# Patient Record
Sex: Female | Born: 1980 | Race: White | Hispanic: No | Marital: Married | Smoking: Former smoker
Health system: Southern US, Community
[De-identification: ages and names within clinical notes are randomized; demographics above are authoritative.]

## PROBLEM LIST (undated history)

## (undated) DIAGNOSIS — J45909 Unspecified asthma, uncomplicated: Secondary | ICD-10-CM

## (undated) DIAGNOSIS — E162 Hypoglycemia, unspecified: Secondary | ICD-10-CM

---

## 2009-06-30 ENCOUNTER — Emergency Department: Payer: Self-pay | Admitting: Emergency Medicine

## 2013-11-08 ENCOUNTER — Emergency Department: Payer: Self-pay | Admitting: Emergency Medicine

## 2013-11-09 ENCOUNTER — Emergency Department: Payer: Self-pay | Admitting: Emergency Medicine

## 2013-11-09 LAB — CBC
HCT: 36.7 % (ref 35.0–47.0)
HGB: 12.4 g/dL (ref 12.0–16.0)
MCH: 31.3 pg (ref 26.0–34.0)
MCHC: 33.9 g/dL (ref 32.0–36.0)
MCV: 92 fL (ref 80–100)
PLATELETS: 200 10*3/uL (ref 150–440)
RBC: 3.97 10*6/uL (ref 3.80–5.20)
RDW: 13.9 % (ref 11.5–14.5)
WBC: 9.7 10*3/uL (ref 3.6–11.0)

## 2013-12-03 ENCOUNTER — Emergency Department: Payer: Self-pay | Admitting: Emergency Medicine

## 2013-12-03 LAB — CBC
HCT: 38.8 % (ref 35.0–47.0)
HGB: 13.2 g/dL (ref 12.0–16.0)
MCH: 30.7 pg (ref 26.0–34.0)
MCHC: 33.9 g/dL (ref 32.0–36.0)
MCV: 91 fL (ref 80–100)
PLATELETS: 209 10*3/uL (ref 150–440)
RBC: 4.28 10*6/uL (ref 3.80–5.20)
RDW: 13.9 % (ref 11.5–14.5)
WBC: 9.7 10*3/uL (ref 3.6–11.0)

## 2013-12-03 LAB — BASIC METABOLIC PANEL
Anion Gap: 10 (ref 7–16)
BUN: 15 mg/dL (ref 7–18)
Calcium, Total: 8.5 mg/dL (ref 8.5–10.1)
Chloride: 105 mmol/L (ref 98–107)
Co2: 26 mmol/L (ref 21–32)
Creatinine: 0.86 mg/dL (ref 0.60–1.30)
EGFR (African American): >60
EGFR (Non-African Amer.): >60
GLUCOSE: 96 mg/dL (ref 65–99)
Osmolality: 282 (ref 275–301)
POTASSIUM: 3.7 mmol/L (ref 3.5–5.1)
SODIUM: 141 mmol/L (ref 136–145)

## 2013-12-03 LAB — TROPONIN I: Troponin-I: <0.02 ng/mL

## 2014-04-16 ENCOUNTER — Emergency Department (HOSPITAL_COMMUNITY)
Admission: EM | Admit: 2014-04-16 | Discharge: 2014-04-16 | Disposition: A | Discharge status: Home / Self Care | Payer: Medicaid Other | Attending: Emergency Medicine | Admitting: Emergency Medicine

## 2014-04-16 ENCOUNTER — Encounter (HOSPITAL_COMMUNITY): Payer: Self-pay | Admitting: Emergency Medicine

## 2014-04-16 DIAGNOSIS — R109 Unspecified abdominal pain: Secondary | ICD-10-CM | POA: Diagnosis present

## 2014-04-16 DIAGNOSIS — Z79899 Other long term (current) drug therapy: Secondary | ICD-10-CM | POA: Insufficient documentation

## 2014-04-16 DIAGNOSIS — J45909 Unspecified asthma, uncomplicated: Secondary | ICD-10-CM | POA: Insufficient documentation

## 2014-04-16 DIAGNOSIS — R102 Pelvic and perineal pain: Secondary | ICD-10-CM

## 2014-04-16 DIAGNOSIS — T836XXA Infection and inflammatory reaction due to prosthetic device, implant and graft in genital tract, initial encounter: Secondary | ICD-10-CM | POA: Insufficient documentation

## 2014-04-16 DIAGNOSIS — N719 Inflammatory disease of uterus, unspecified: Secondary | ICD-10-CM

## 2014-04-16 DIAGNOSIS — Z3202 Encounter for pregnancy test, result negative: Secondary | ICD-10-CM | POA: Diagnosis not present

## 2014-04-16 DIAGNOSIS — Y848 Other medical procedures as the cause of abnormal reaction of the patient, or of later complication, without mention of misadventure at the time of the procedure: Secondary | ICD-10-CM | POA: Insufficient documentation

## 2014-04-16 DIAGNOSIS — T8369XA Infection and inflammatory reaction due to other prosthetic device, implant and graft in genital tract, initial encounter: Secondary | ICD-10-CM

## 2014-04-16 HISTORY — DX: Unspecified asthma, uncomplicated: J45.909

## 2014-04-16 LAB — URINE MICROSCOPIC-ADD ON

## 2014-04-16 LAB — URINALYSIS, ROUTINE W REFLEX MICROSCOPIC
BILIRUBIN URINE: NEGATIVE
Glucose, UA: NEGATIVE mg/dL
Ketones, ur: NEGATIVE mg/dL
Leukocytes, UA: NEGATIVE
Nitrite: NEGATIVE
PROTEIN: NEGATIVE mg/dL
Specific Gravity, Urine: 1.014 (ref 1.005–1.030)
Urobilinogen, UA: 0.2 mg/dL (ref 0.0–1.0)
pH: 7 (ref 5.0–8.0)

## 2014-04-16 LAB — WET PREP, GENITAL
Clue Cells Wet Prep HPF POC: NONE SEEN
TRICH WET PREP: NONE SEEN
WBC, Wet Prep HPF POC: NONE SEEN
YEAST WET PREP: NONE SEEN

## 2014-04-16 LAB — POC URINE PREG, ED: Preg Test, Ur: NEGATIVE

## 2014-04-16 MED ORDER — CEFTRIAXONE SODIUM 250 MG IJ SOLR: 250.0000 mg | Freq: Once | INTRAMUSCULAR | Status: DC

## 2014-04-16 MED ORDER — LIDOCAINE HCL (PF) 1 % IJ SOLN
INTRAMUSCULAR | Status: AC
  Administered 2014-04-16: 5 mL
  Filled 2014-04-16: qty 5

## 2014-04-16 MED ORDER — DOXYCYCLINE HYCLATE 100 MG PO CAPS: 100.0000 mg | ORAL_CAPSULE | Freq: Two times a day (BID) | ORAL | Status: DC

## 2014-04-16 MED ORDER — AZITHROMYCIN 250 MG PO TABS: 1000.0000 mg | ORAL_TABLET | Freq: Once | ORAL | Status: DC

## 2014-04-16 MED ORDER — CEFTRIAXONE SODIUM 250 MG IJ SOLR
250.0000 mg | Freq: Once | INTRAMUSCULAR | Status: AC
  Administered 2014-04-16: 250 mg via INTRAMUSCULAR
  Filled 2014-04-16: qty 250

## 2014-04-16 MED ORDER — METRONIDAZOLE 500 MG PO TABS: 500.0000 mg | ORAL_TABLET | Freq: Three times a day (TID) | ORAL | Status: DC

## 2014-04-16 MED ORDER — HYDROCODONE-ACETAMINOPHEN 5-325 MG PO TABS: 2.0000 | ORAL_TABLET | ORAL | Status: DC | PRN

## 2014-04-16 MED ORDER — LIDOCAINE HCL (PF) 1 % IJ SOLN
5.0000 mL | Freq: Once | INTRAMUSCULAR | Status: AC
  Administered 2014-04-16: 5 mL

## 2014-04-16 MED ORDER — AZITHROMYCIN 1 G PO PACK
1.0000 g | PACK | Freq: Once | ORAL | Status: AC
  Administered 2014-04-16: 1 g via ORAL
  Filled 2014-04-16: qty 1

## 2014-04-16 NOTE — ED Notes (Signed)
Pt here for abd pain, bleeding. sts that she has had an IUD for years and was told to have it surgically removed but never did. sts now she is bleeding and having pain and breast are tender.

## 2014-04-16 NOTE — Discharge Instructions (Signed)
Pelvic Pain Pelvic pain is pain felt below the belly button and between your hips. It can be caused by many different things. It is important to get help right away. This is especially true for severe, sharp, or unusual pain that comes on suddenly.  HOME CARE  Only take medicine as told by your doctor.  Rest as told by your doctor.  Eat a healthy diet, such as fruits, vegetables, and lean meats.  Drink enough fluids to keep your pee (urine) clear or pale yellow, or as told.  Avoid sex (intercourse) if it causes pain.  Apply warm or cold packs to your lower belly (abdomen). Use the type of pack that helps the pain.  Avoid situations that cause you stress.  Keep a journal to track your pain. Write down:  When the pain started.  Where it is located.  If there are things that seem to be related to the pain, such as food or your period.  Follow up with your doctor as told. GET HELP RIGHT AWAY IF:   You have heavy bleeding from the vagina.  You have more pelvic pain.  You feel lightheaded or pass out (faint).  You have chills.  You have pain when you pee or have blood in your pee.  You cannot stop having watery poop (diarrhea).  You cannot stop throwing up (vomiting).  You have a fever or lasting symptoms for more than 3 days.  You have a fever and your symptoms suddenly get worse.  You are being physically or sexually abused.  Your medicine does not help your pain.  You have fluid (discharge) coming from your vagina that is not normal. MAKE SURE YOU:  Understand these instructions.  Will watch your condition.  Will get help if you are not doing well or get worse. Document Released: 06/08/2007 Document Revised: 06/21/2011 Document Reviewed: 04/11/2011 [facility] Patient Information 2015 [place], [place]. This information is not intended to replace advice given to you by your health care provider. Make sure you discuss any questions you have with your health care  provider.  Endometritis Endometritis is an irritation, soreness, and swelling (inflammation) of the lining of the uterus (endometrium).  CAUSES   Bacterial infections.  Sexually transmitted infections (STIs).  Having a miscarriage or childbirth, especially after a long labor or cesarean delivery.  Certain gynecological procedures (such as dilation and curettage, hysteroscopy, or contraceptive insertion). SIGNS AND SYMPTOMS   Fever.  Lower abdominal or pelvic pain.  Abnormal vaginal discharge or bleeding.  Abdominal bloating (distention) or swelling.  General discomfort or ill feeling.  Discomfort with bowel movements. DIAGNOSIS  A physical and pelvic exam are performed. Other tests may include:  Cultures from the cervix.  Blood tests.  Examining a tissue sample of the uterine lining (endometrial biopsy).  Examining discharge under a microscope (wet prep).  Laparoscopy. TREATMENT  Antibiotic medicines are usually given. Other treatments may include:  Fluids through an IV tube inserted in your vein.  Rest. HOME CARE INSTRUCTIONS   Take over-the-counter or prescription medicines for pain, discomfort, or fever as directed by your health care provider.  Take your antibiotics as directed. Finish them even if you start to feel better.  Resume your normal diet and activities as directed or as tolerated.  Do not douche or have sexual intercourse until your health care provider says it is okay.  Do not have sexual intercourse until your partner has been treated if your endometritis is caused by an STI. SEEK IMMEDIATE  MEDICAL CARE IF:   You have swelling or increasing pain in the abdomen.  You have a fever.  You have bad smelling vaginal discharge, or you have an increased amount of discharge.  You have abnormal vaginal bleeding.  Your medicine is not helping with the pain.  You experience any problems that may be related to the medicine you are taking.  You  have nausea and vomiting, or you cannot keep foods down.  You have pain with bowel movements. MAKE SURE YOU:   Understand these instructions.  Will watch your condition.  Will get help right away if you are not doing well or get worse. Document Released: 12/14/2000 Document Revised: 08/22/2012 Document Reviewed: 07/19/2012 [facility] Patient Information 2015 [employer], [place]. This information is not intended to replace advice given to you by your health care provider. Make sure you discuss any questions you have with your health care provider.

## 2014-04-16 NOTE — ED Provider Notes (Signed)
CSN: [id number]     Arrival date & time 04/16/14  [id number] History   First MD Initiated Contact with Patient 04/16/14 0813     Chief Complaint  Patient presents with  . Vaginal Bleeding  . Abdominal Pain      HPI  Patient presents for evaluation of abdominal pain, and some vaginal bleeding. Has had an IUD in for approximately 8-10 years. States at one point she was told the string was no longer visible, but had been removed "surgical". She's not had a period for over 8 years. Has symmetrical bleeding yesterday and thought she was getting her menses. However she has suprapubic pain that has become worse and she presents here. Denies dyspareunia. Did not note any discharge, however has discharged on exam today. No dysuria or frank hematuria no fevers or chills. Good appetite no vomiting.  Past Medical History  Diagnosis Date  . Asthma    Past Surgical History  Procedure Laterality Date  . Cesarean section     History reviewed. No pertinent family history. History  Substance Use Topics  . Smoking status: Never Smoker   . Smokeless tobacco: Not on file  . Alcohol Use: No   OB History    Gravida Para Term Preterm AB TAB SAB Ectopic Multiple Living   6 3 3       3      Review of Systems  Constitutional: Negative for fever, chills, diaphoresis, appetite change and fatigue.  HENT: Negative for mouth sores, sore throat and trouble swallowing.   Eyes: Negative for visual disturbance.  Respiratory: Negative for cough, chest tightness, shortness of breath and wheezing.   Cardiovascular: Negative for chest pain.  Gastrointestinal: Negative for nausea, vomiting, abdominal pain, diarrhea and abdominal distention.  Endocrine: Negative for polydipsia, polyphagia and polyuria.  Genitourinary: Positive for vaginal bleeding and pelvic pain. Negative for dysuria, frequency and hematuria.  Musculoskeletal: Negative for gait problem.  Skin: Negative for color change, pallor and rash.  Neurological:  Negative for dizziness, syncope, light-headedness and headaches.  Hematological: Does not bruise/bleed easily.  Psychiatric/Behavioral: Negative for behavioral problems and confusion.      Allergies  Review of patient's allergies indicates no known allergies.  Home Medications   Prior to Admission medications   Medication Sig Start Date End Date Taking? Authorizing Provider  Ascorbic Acid (VITAMIN C PO) Take 1 tablet by mouth daily.    Yes Historical Provider, MD  GOLDEN SEAL PO Take 1 tablet by mouth daily.    Yes Historical Provider, MD  Myrrh OIL 1 each by Does not apply route daily.    Yes Historical Provider, MD  Specialty Vitamins Products (ECHINACEA C COMPLETE PO) Take 1 tablet by mouth daily.    Yes Historical Provider, MD  doxycycline (VIBRAMYCIN) 100 MG capsule Take 1 capsule (100 mg total) by mouth 2 (two) times daily. 04/16/14   Rolland PorterMark Tauheedah Bok, MD  HYDROcodone-acetaminophen (NORCO/VICODIN) 5-325 MG per tablet Take 2 tablets by mouth every 4 (four) hours as needed. 04/16/14   Rolland PorterMark Richelle Glick, MD  metroNIDAZOLE (FLAGYL) 500 MG tablet Take 1 tablet (500 mg total) by mouth 3 (three) times daily. 04/16/14   Rolland PorterMark Starletta Houchin, MD   BP 112/54 mmHg  Pulse 71  Temp(Src) 98.1 F (36.7 C) (Oral)  Resp 20  SpO2 100% Physical Exam  Constitutional: She is oriented to person, place, and time. She appears well-developed and well-nourished. No distress.  HENT:  Head: Normocephalic.  Eyes: Conjunctivae are normal. Pupils are equal, round, and reactive  to light. No scleral icterus.  Neck: Normal range of motion. Neck supple. No thyromegaly present.  Cardiovascular: Normal rate and regular rhythm.  Exam reveals no gallop and no friction rub.   No murmur heard. Pulmonary/Chest: Effort normal and breath sounds normal. No respiratory distress. She has no wheezes. She has no rales.  Abdominal: Soft. Bowel sounds are normal. She exhibits no distension. There is no tenderness. There is no rebound.    Genitourinary:    Musculoskeletal: Normal range of motion.  Neurological: She is alert and oriented to person, place, and time.  Skin: Skin is warm and dry. No rash noted.  Psychiatric: She has a normal mood and affect. Her behavior is normal.    ED Course  Procedures (including critical care time) Labs Review Labs Reviewed  URINALYSIS, ROUTINE W REFLEX MICROSCOPIC - Abnormal; Notable for the following:    Hgb urine dipstick SMALL (*)    All other components within normal limits  WET PREP, GENITAL  URINE MICROSCOPIC-ADD ON  POC URINE PREG, ED  GC/CHLAMYDIA PROBE AMP ([place])    Imaging Review No results found.   EKG Interpretation None      MDM   Final diagnoses:  Pelvic pain in female  Endometritis  IUD infection, initial encounter    Into the consistent with pelvic infection. Urine does not show obvious infection and not pregnant. She does report some breast tenderness. We discussed the possibility this being some hormonal change and may be actually just premenstrual her dysmenorrhea. However with the tenderness and discharged my preference was to treat her empirically with consideration of having an IUD. Given IM Rocephin abuse of cervix. Discharge with doxycycline and Flagyl in GYN follow-up.    Rolland Porter, MD 04/16/14 1055

## 2014-04-16 NOTE — Progress Notes (Signed)
pcp is [facility] [address] [place], [place] [phone]

## 2014-04-17 LAB — GC/CHLAMYDIA PROBE AMP (~~LOC~~) NOT AT ARMC
CHLAMYDIA, DNA PROBE: NEGATIVE
NEISSERIA GONORRHEA: NEGATIVE

## 2014-05-09 ENCOUNTER — Encounter (HOSPITAL_COMMUNITY): Payer: Self-pay | Admitting: Nurse Practitioner

## 2014-05-09 ENCOUNTER — Emergency Department (HOSPITAL_COMMUNITY)
Admission: EM | Admit: 2014-05-09 | Discharge: 2014-05-09 | Disposition: A | Discharge status: Home / Self Care | Payer: Medicaid Other | Attending: Emergency Medicine | Admitting: Emergency Medicine

## 2014-05-09 ENCOUNTER — Emergency Department (HOSPITAL_COMMUNITY): Payer: Medicaid Other

## 2014-05-09 DIAGNOSIS — R102 Pelvic and perineal pain: Secondary | ICD-10-CM | POA: Diagnosis not present

## 2014-05-09 DIAGNOSIS — Z79899 Other long term (current) drug therapy: Secondary | ICD-10-CM | POA: Insufficient documentation

## 2014-05-09 DIAGNOSIS — M545 Low back pain: Secondary | ICD-10-CM | POA: Diagnosis not present

## 2014-05-09 DIAGNOSIS — Z9889 Other specified postprocedural states: Secondary | ICD-10-CM | POA: Insufficient documentation

## 2014-05-09 DIAGNOSIS — J45909 Unspecified asthma, uncomplicated: Secondary | ICD-10-CM | POA: Insufficient documentation

## 2014-05-09 DIAGNOSIS — Z792 Long term (current) use of antibiotics: Secondary | ICD-10-CM | POA: Insufficient documentation

## 2014-05-09 DIAGNOSIS — Z3202 Encounter for pregnancy test, result negative: Secondary | ICD-10-CM | POA: Diagnosis not present

## 2014-05-09 LAB — URINALYSIS, ROUTINE W REFLEX MICROSCOPIC
Bilirubin Urine: NEGATIVE
Glucose, UA: NEGATIVE mg/dL
Hgb urine dipstick: NEGATIVE
Ketones, ur: NEGATIVE mg/dL
Leukocytes, UA: NEGATIVE
Nitrite: NEGATIVE
Protein, ur: NEGATIVE mg/dL
Specific Gravity, Urine: 1.019 (ref 1.005–1.030)
Urobilinogen, UA: 0.2 mg/dL (ref 0.0–1.0)
pH: 6 (ref 5.0–8.0)

## 2014-05-09 LAB — PREGNANCY, URINE: Preg Test, Ur: NEGATIVE

## 2014-05-09 MED ORDER — OXYCODONE-ACETAMINOPHEN 5-325 MG PO TABS
1.0000 | ORAL_TABLET | Freq: Once | ORAL | Status: DC
  Filled 2014-05-09: qty 1

## 2014-05-09 MED ORDER — HYDROCODONE-ACETAMINOPHEN 5-325 MG PO TABS: 1.0000 | ORAL_TABLET | Freq: Four times a day (QID) | ORAL | Status: DC | PRN

## 2014-05-09 MED ORDER — IBUPROFEN 400 MG PO TABS
800.0000 mg | ORAL_TABLET | Freq: Once | ORAL | Status: AC
  Administered 2014-05-09: 800 mg via ORAL
  Filled 2014-05-09: qty 2

## 2014-05-09 MED ORDER — IBUPROFEN 800 MG PO TABS: 800.0000 mg | ORAL_TABLET | Freq: Three times a day (TID) | ORAL | Status: DC | PRN

## 2014-05-09 NOTE — ED Provider Notes (Signed)
CSN: [id number]     Arrival date & time 05/09/14  1214 History  This chart was scribed for non-physician practitioner Ebbie Ridgehris Escarlet Saathoff, PA, working with Glynn OctaveStephen Rancour, MD, by Tanda RockersMargaux Venter, ED Scribe. This patient was seen in room TR03C/TR03C and the patient's care was started at 1:41 PM.     Chief Complaint  Patient presents with  . Pelvic Pain    The history is provided by the patient. No language interpreter was used.     HPI Comments: Melanie Jenkins is a 34 y.o. female who presents to the Emergency Department complaining of lower back pain and suprapubic pain that began approximately 3 weeks ago. Pt states that the back pain is greater than the suprapubic pain. She describes the pain as severe and cramping in sensation. The pain is exacerbated with sitting and movement. The only time the pain is alleviated is with lying down. Pt was seen in the ED on 4/13 (approximately 3 weeks ago) for abdominal pain and mild vaginal bleeding. She was diagnosed with an IUD infection at that time and told to follow up with her OBGYN. Pt saw her OBGYN approximately 2 days ago. They were unable to find her IUD at that time and scheduled an ultrasound for next week. She was told to come to the ED if her pain worsens. She denies fever, chills, nausea, vomiting, vaginal discharge, or any other symptoms.   Past Medical History  Diagnosis Date  . Asthma    Past Surgical History  Procedure Laterality Date  . Cesarean section     History reviewed. No pertinent family history. History  Substance Use Topics  . Smoking status: Never Smoker   . Smokeless tobacco: Not on file  . Alcohol Use: No   OB History    Gravida Para Term Preterm AB TAB SAB Ectopic Multiple Living   6 3 3       3      Review of Systems  A complete 10 system review of systems was obtained and all systems are negative except as noted in the HPI and PMH.    Allergies  Review of patient's allergies indicates no known allergies.  Home  Medications   Prior to Admission medications   Medication Sig Start Date End Date Taking? Authorizing Provider  Ascorbic Acid (VITAMIN C PO) Take 1 tablet by mouth daily.     Historical Provider, MD  doxycycline (VIBRAMYCIN) 100 MG capsule Take 1 capsule (100 mg total) by mouth 2 (two) times daily. 04/16/14   Rolland PorterMark James, MD  GOLDEN SEAL PO Take 1 tablet by mouth daily.     Historical Provider, MD  HYDROcodone-acetaminophen (NORCO/VICODIN) 5-325 MG per tablet Take 2 tablets by mouth every 4 (four) hours as needed. 04/16/14   Rolland PorterMark James, MD  metroNIDAZOLE (FLAGYL) 500 MG tablet Take 1 tablet (500 mg total) by mouth 3 (three) times daily. 04/16/14   Rolland PorterMark James, MD  Myrrh OIL 1 each by Does not apply route daily.     Historical Provider, MD  Specialty Vitamins Products (ECHINACEA C COMPLETE PO) Take 1 tablet by mouth daily.     Historical Provider, MD   Triage Vitals: BP 141/96 mmHg  Pulse 76  Temp(Src) 98.1 F (36.7 C) (Oral)  Resp 20  SpO2 100%   Physical Exam  Constitutional: She is oriented to person, place, and time. She appears well-developed and well-nourished. No distress.  HENT:  Head: Normocephalic and atraumatic.  Eyes: Conjunctivae and EOM are normal.  Neck: Neck  supple. No tracheal deviation present.  Cardiovascular: Normal rate.   Pulmonary/Chest: Effort normal. No respiratory distress.  Abdominal: Soft. There is tenderness.  Lower abdominal and pelvic pain diffusely.   Musculoskeletal: Normal range of motion.  Neurological: She is alert and oriented to person, place, and time.  Skin: Skin is warm and dry.  Psychiatric: She has a normal mood and affect. Her behavior is normal.  Nursing note and vitals reviewed.   ED Course  Procedures (including critical care time)  DIAGNOSTIC STUDIES: Oxygen Saturation is 100% on RA, normal by my interpretation.    COORDINATION OF CARE: 1:46 PM-Discussed treatment plan which includes UA and Urine pregnancy with pt at bedside and pt  agreed to plan.   Labs Review Labs Reviewed  URINALYSIS, ROUTINE W REFLEX MICROSCOPIC  PREGNANCY, URINE    Imaging Review US Transvaginal Non-ob  05/09/2014   CLINICAL DATA:  Left pelvic pain  EXAM: TRANSABDOMINAL AND TRANSVAGINAL ULTRASOUND OF PELVIS  DOPPLER ULTRASOUND OF OVARIES  TECHNIQUE: Both transabdominal and transvaginal ultrasound examinations of the pelvis were performed. Transabdominal technique was performed for global imaging of the pelvis including uterus, ovaries, adnexal regions, and pelvic cul-de-sac.  It was necessary to proceed with endovaginal exam following the transabdominal exam to visualize the ovaries and endometrium. Color and duplex Doppler ultrasound was utilized to evaluate blood flow to the ovaries.  COMPARISON:  None.  FINDINGS: Image detail and diagnostic sensitivity is diminished by patient size.  Uterus  Measurements: 9 x 5 x 5 cm. No fibroids or other mass visualized.  Endometrium  Thickness: 6 mm. There is an IUD which appears centrally located. No evidence of focal endometrial thickening.  Right ovary  Measurements: 2.9 x 1.9 x 2.7 cm. Normal appearance/no adnexal mass.  Left ovary  Measurements: 2.6 x 2.2 x 2.6 cm. Normal appearance/no adnexal mass.  Pulsed Doppler evaluation of both ovaries demonstrates normal low-resistance arterial and venous waveforms.  Other findings  No free fluid.  IMPRESSION: 1. No ovarian torsion or other explanation for pelvic pain. 2. IUD in unremarkable position.   Electronically Signed   By: Marnee Spring M.D.   On: 05/09/2014 15:29   US Pelvis Complete  05/09/2014   CLINICAL DATA:  Left pelvic pain  EXAM: TRANSABDOMINAL AND TRANSVAGINAL ULTRASOUND OF PELVIS  DOPPLER ULTRASOUND OF OVARIES  TECHNIQUE: Both transabdominal and transvaginal ultrasound examinations of the pelvis were performed. Transabdominal technique was performed for global imaging of the pelvis including uterus, ovaries, adnexal regions, and pelvic cul-de-sac.  It was  necessary to proceed with endovaginal exam following the transabdominal exam to visualize the ovaries and endometrium. Color and duplex Doppler ultrasound was utilized to evaluate blood flow to the ovaries.  COMPARISON:  None.  FINDINGS: Image detail and diagnostic sensitivity is diminished by patient size.  Uterus  Measurements: 9 x 5 x 5 cm. No fibroids or other mass visualized.  Endometrium  Thickness: 6 mm. There is an IUD which appears centrally located. No evidence of focal endometrial thickening.  Right ovary  Measurements: 2.9 x 1.9 x 2.7 cm. Normal appearance/no adnexal mass.  Left ovary  Measurements: 2.6 x 2.2 x 2.6 cm. Normal appearance/no adnexal mass.  Pulsed Doppler evaluation of both ovaries demonstrates normal low-resistance arterial and venous waveforms.  Other findings  No free fluid.  IMPRESSION: 1. No ovarian torsion or other explanation for pelvic pain. 2. IUD in unremarkable position.   Electronically Signed   By: Marnee Spring M.D.   On: 05/09/2014 15:29  [place] Art/ven Flow Abd Pelv Doppler  05/09/2014   CLINICAL DATA:  Left pelvic pain  EXAM: TRANSABDOMINAL AND TRANSVAGINAL ULTRASOUND OF PELVIS  DOPPLER ULTRASOUND OF OVARIES  TECHNIQUE: Both transabdominal and transvaginal ultrasound examinations of the pelvis were performed. Transabdominal technique was performed for global imaging of the pelvis including uterus, ovaries, adnexal regions, and pelvic cul-de-sac.  It was necessary to proceed with endovaginal exam following the transabdominal exam to visualize the ovaries and endometrium. Color and duplex Doppler ultrasound was utilized to evaluate blood flow to the ovaries.  COMPARISON:  None.  FINDINGS: Image detail and diagnostic sensitivity is diminished by patient size.  Uterus  Measurements: 9 x 5 x 5 cm. No fibroids or other mass visualized.  Endometrium  Thickness: 6 mm. There is an IUD which appears centrally located. No evidence of focal endometrial thickening.  Right ovary   Measurements: 2.9 x 1.9 x 2.7 cm. Normal appearance/no adnexal mass.  Left ovary  Measurements: 2.6 x 2.2 x 2.6 cm. Normal appearance/no adnexal mass.  Pulsed Doppler evaluation of both ovaries demonstrates normal low-resistance arterial and venous waveforms.  Other findings  No free fluid.  IMPRESSION: 1. No ovarian torsion or other explanation for pelvic pain. 2. IUD in unremarkable position.   Electronically Signed   By: Marnee SpringJonathon  Watts M.D.   On: 05/09/2014 15:29   I personally performed the services described in this documentation, which was scribed in my presence. The recorded information has been reviewed and is accurate.    She will be referred back to her GYN doctor.  The IUD is centrally located in her uterus.  The patient will be given pain control told to return here as needed.  The patient refused vaginal exam, she just had one on Tuesday.  Charlestine NightChristopher Trevione Wert, PA-C 05/09/14 1636  Glynn OctaveStephen Rancour, MD 05/09/14 [phone]

## 2014-05-09 NOTE — ED Notes (Signed)
Pt was refusing to get into a gown, stressed importance of getting undressed, will discuss with provider

## 2014-05-09 NOTE — Discharge Instructions (Signed)
Return here as needed.  Follow-up with your GYN doctor.  °

## 2014-05-09 NOTE — ED Notes (Addendum)
She reports she was diagnosed with infected IUD here on 4/13 but it was unable to be removed "because they could not find it." she has followed up with OBGYN and they scheduled her for an ultrasound but today her pelvic pain is worse. She states she could not even get ready today due to pain. Lying is the only thing that relieves pain. She denies vaginal discharge or urinary changes.  Denies fevers, chills, n/v.

## 2014-09-16 ENCOUNTER — Emergency Department (HOSPITAL_COMMUNITY): Payer: Medicaid Other

## 2014-09-16 ENCOUNTER — Encounter (HOSPITAL_COMMUNITY): Payer: Self-pay | Admitting: Emergency Medicine

## 2014-09-16 ENCOUNTER — Emergency Department (HOSPITAL_COMMUNITY)
Admission: EM | Admit: 2014-09-16 | Discharge: 2014-09-17 | Discharge status: Against Medical Advice | Payer: Medicaid Other | Attending: Emergency Medicine | Admitting: Emergency Medicine

## 2014-09-16 DIAGNOSIS — R079 Chest pain, unspecified: Secondary | ICD-10-CM | POA: Diagnosis present

## 2014-09-16 DIAGNOSIS — I10 Essential (primary) hypertension: Secondary | ICD-10-CM | POA: Diagnosis not present

## 2014-09-16 DIAGNOSIS — J45909 Unspecified asthma, uncomplicated: Secondary | ICD-10-CM | POA: Diagnosis not present

## 2014-09-16 LAB — I-STAT BETA HCG BLOOD, ED (MC, WL, AP ONLY): I-stat hCG, quantitative: <5 m[IU]/mL (ref <5)

## 2014-09-16 LAB — CBC WITH DIFFERENTIAL/PLATELET
Basophils Absolute: 0 10*3/uL (ref 0.0–0.1)
Basophils Relative: 0 % (ref 0–1)
Eosinophils Absolute: 0 10*3/uL (ref 0.0–0.7)
Eosinophils Relative: 1 % (ref 0–5)
HEMATOCRIT: 38.6 % (ref 36.0–46.0)
HEMOGLOBIN: 13.1 g/dL (ref 12.0–15.0)
LYMPHS ABS: 1.1 10*3/uL (ref 0.7–4.0)
LYMPHS PCT: 13 % (ref 12–46)
MCH: 30.3 pg (ref 26.0–34.0)
MCHC: 33.9 g/dL (ref 30.0–36.0)
MCV: 89.4 fL (ref 78.0–100.0)
MONOS PCT: 6 % (ref 3–12)
Monocytes Absolute: 0.5 10*3/uL (ref 0.1–1.0)
NEUTROS ABS: 6.8 10*3/uL (ref 1.7–7.7)
NEUTROS PCT: 80 % — AB (ref 43–77)
Platelets: 211 10*3/uL (ref 150–400)
RBC: 4.32 MIL/uL (ref 3.87–5.11)
RDW: 14.2 % (ref 11.5–15.5)
WBC: 8.4 10*3/uL (ref 4.0–10.5)

## 2014-09-16 LAB — BASIC METABOLIC PANEL
Anion gap: 8 (ref 5–15)
BUN: 7 mg/dL (ref 6–20)
CO2: 28 mmol/L (ref 22–32)
Calcium: 9.6 mg/dL (ref 8.9–10.3)
Chloride: 102 mmol/L (ref 101–111)
Creatinine, Ser: 0.79 mg/dL (ref 0.44–1.00)
GFR calc Af Amer: >60 mL/min (ref >60)
GFR calc non Af Amer: >60 mL/min (ref >60)
Glucose, Bld: 93 mg/dL (ref 65–99)
Potassium: 3.9 mmol/L (ref 3.5–5.1)
SODIUM: 138 mmol/L (ref 135–145)

## 2014-09-16 LAB — I-STAT TROPONIN, ED: Troponin i, poc: 0 ng/mL (ref 0.00–0.08)

## 2014-09-16 NOTE — ED Notes (Signed)
Called to move pt back to room with no answerx 2

## 2014-09-16 NOTE — ED Notes (Signed)
Pt sts not feeling well with some palpitations and CP x 3 days and noted htn today when checked; pt sts recent increase in stress and caffeine consumption

## 2014-10-18 ENCOUNTER — Encounter: Payer: Self-pay | Admitting: Emergency Medicine

## 2014-10-18 ENCOUNTER — Emergency Department
Admission: EM | Admit: 2014-10-18 | Discharge: 2014-10-18 | Disposition: A | Discharge status: Home / Self Care | Payer: Medicaid Other | Attending: Emergency Medicine | Admitting: Emergency Medicine

## 2014-10-18 DIAGNOSIS — J029 Acute pharyngitis, unspecified: Secondary | ICD-10-CM

## 2014-10-18 DIAGNOSIS — F419 Anxiety disorder, unspecified: Secondary | ICD-10-CM | POA: Insufficient documentation

## 2014-10-18 DIAGNOSIS — Z792 Long term (current) use of antibiotics: Secondary | ICD-10-CM | POA: Diagnosis not present

## 2014-10-18 DIAGNOSIS — Z79899 Other long term (current) drug therapy: Secondary | ICD-10-CM | POA: Insufficient documentation

## 2014-10-18 DIAGNOSIS — K1370 Unspecified lesions of oral mucosa: Secondary | ICD-10-CM

## 2014-10-18 LAB — POCT RAPID STREP A: Streptococcus, Group A Screen (Direct): NEGATIVE

## 2014-10-18 MED ORDER — MAGIC MOUTHWASH W/LIDOCAINE: 5.0000 mL | Freq: Four times a day (QID) | ORAL | Status: DC

## 2014-10-18 MED ORDER — DEXAMETHASONE SODIUM PHOSPHATE 10 MG/ML IJ SOLN
10.0000 mg | Freq: Once | INTRAMUSCULAR | Status: AC
  Administered 2014-10-18: 10 mg via INTRAMUSCULAR
  Filled 2014-10-18: qty 1

## 2014-10-18 NOTE — ED Provider Notes (Signed)
[facility] Emergency Department Provider Note  ____________________________________________  Time seen: Approximately 2:05 PM  I have reviewed the triage vital signs and the nursing notes.   HISTORY  Chief Complaint Sore Throat    HPI Melanie Jenkins is a 34 y.o. female patient awakened this morning with a sore throat and a lesion protruding from her oral ovula. Patient states she has history anxiety in see the lesion discussed her to have some redness of breath. Patient denies any fever or chills associated with this complaint. No palliative measures taken for this complaint. Patient denies any pain.   Past Medical History  Diagnosis Date  . Asthma     There are no active problems to display for this patient.   Past Surgical History  Procedure Laterality Date  . Cesarean section      Current Outpatient Rx  Name  Route  Sig  Dispense  Refill  . Ascorbic Acid (VITAMIN C PO)   Oral   Take 1 tablet by mouth daily.          Marland Kitchen doxycycline (VIBRAMYCIN) 100 MG capsule   Oral   Take 1 capsule (100 mg total) by mouth 2 (two) times daily.   20 capsule   0   . GOLDEN SEAL PO   Oral   Take 1 tablet by mouth daily.          Marland Kitchen HYDROcodone-acetaminophen (NORCO/VICODIN) 5-325 MG per tablet   Oral   Take 1 tablet by mouth every 6 (six) hours as needed for moderate pain.   15 tablet   0   . ibuprofen (ADVIL,MOTRIN) 800 MG tablet   Oral   Take 1 tablet (800 mg total) by mouth every 8 (eight) hours as needed.   21 tablet   0   . metroNIDAZOLE (FLAGYL) 500 MG tablet   Oral   Take 1 tablet (500 mg total) by mouth 3 (three) times daily.   30 tablet   0   . Myrrh OIL   Does not apply   1 each by Does not apply route daily.          Marland Kitchen Specialty Vitamins Products (ECHINACEA C COMPLETE PO)   Oral   Take 1 tablet by mouth daily.            Allergies Review of patient's allergies indicates no known allergies.  History reviewed. No  pertinent family history.  Social History Social History  Substance Use Topics  . Smoking status: Never Smoker   . Smokeless tobacco: None  . Alcohol Use: Yes     Comment: occ    Review of Systems Constitutional: No fever/chills Eyes: No visual changes. ENT: Sore throat. Cardiovascular: Denies chest pain. Respiratory: Denies shortness of breath. Gastrointestinal: No abdominal pain.  No nausea, no vomiting.  No diarrhea.  No constipation. Genitourinary: Negative for dysuria. Musculoskeletal: Negative for back pain. Skin: Negative for rash. Neurological: Negative for headaches, focal weakness or numbness. Psychiatric:Anxiety 10-point ROS otherwise negative.  ____________________________________________   PHYSICAL EXAM:  VITAL SIGNS: ED Triage Vitals  Enc Vitals Group     BP 10/18/14 1320 124/63 mmHg     Pulse Rate 10/18/14 1320 90     Resp 10/18/14 1320 20     Temp 10/18/14 1320 98.2 F (36.8 C)     Temp Source 10/18/14 1320 Oral     SpO2 10/18/14 1320 97 %     Weight 10/18/14 1320 320 lb (145.151 kg)  Height 10/18/14 1320 5\' 5"  (1.651 m)     Head Cir --      Peak Flow --      Pain Score --      Pain Loc --      Pain Edu? --      Excl. in GC? --    Constitutional: Alert and oriented. Well appearing and in no acute distress. Eyes: Conjunctivae are normal. PERRL. EOMI. Head: Atraumatic. Nose: No congestion/rhinnorhea. Mouth/Throat: Mucous membranes are moist.  Oropharynx erythematous. Mucosa lesion. Neck: No stridor.  No cervical spine tenderness to palpation. Hematological/Lymphatic/Immunilogical: No cervical lymphadenopathy. Cardiovascular: Normal rate, regular rhythm. Grossly normal heart sounds.  Good peripheral circulation. Respiratory: Normal respiratory effort.  No retractions. Lungs CTAB. Gastrointestinal: Soft and nontender. No distention. No abdominal bruits. No CVA tenderness. Musculoskeletal: No lower extremity tenderness nor edema.  No joint  effusions. Neurologic:  Normal speech and language. No gross focal neurologic deficits are appreciated. No gait instability. Skin:  Skin is warm, dry and intact. No rash noted. Psychiatric: Mood and affect are normal. Speech and behavior are normal.  ____________________________________________   LABS (all labs ordered are listed, but only abnormal results are displayed)  Labs Reviewed  POCT RAPID STREP A   ____________________________________________  EKG   ____________________________________________  RADIOLOGY   ____________________________________________   PROCEDURES  Procedure(s) performed: None  Critical Care performed: No  ____________________________________________   INITIAL IMPRESSION / ASSESSMENT AND PLAN / ED COURSE  Pertinent labs & imaging results that were available during my care of the patient were reviewed by me and considered in my medical decision making (see chart for details Oral mucosal lesion and pharyngitis. Patient had negative rapid strep test. Patient advised to follow with the ENT clinic in 2 days for definitive treatment. Patient advised return by ER for condition worsens. ____________________________________________   FINAL CLINICAL IMPRESSION(S) / ED DIAGNOSES  Final diagnoses:  Oral mucosal disease  Viral pharyngitis      Joni ReiningRonald K Sujey Gundry, PA-C 10/18/14 1453  Phineas SemenGraydon Goodman, MD 10/18/14 [phone]

## 2014-10-18 NOTE — ED Notes (Signed)
Pt reports waking up this am with sore throat and felt sob.  No resp distress

## 2014-10-18 NOTE — Discharge Instructions (Signed)
Folllow up with ENT clinic. in 2 days.

## 2014-10-18 NOTE — ED Notes (Signed)
NAD noted at time of D/C. Pt denies questions or concerns. Pt ambulatory to the lobby at this time.  

## 2014-11-12 ENCOUNTER — Emergency Department (HOSPITAL_COMMUNITY): Payer: Medicaid Other

## 2014-11-12 ENCOUNTER — Encounter (HOSPITAL_COMMUNITY): Payer: Self-pay | Admitting: Emergency Medicine

## 2014-11-12 ENCOUNTER — Emergency Department (HOSPITAL_COMMUNITY)
Admission: EM | Admit: 2014-11-12 | Discharge: 2014-11-12 | Discharge status: Against Medical Advice | Payer: Medicaid Other | Attending: Emergency Medicine | Admitting: Emergency Medicine

## 2014-11-12 DIAGNOSIS — R079 Chest pain, unspecified: Secondary | ICD-10-CM | POA: Insufficient documentation

## 2014-11-12 DIAGNOSIS — R51 Headache: Secondary | ICD-10-CM | POA: Insufficient documentation

## 2014-11-12 DIAGNOSIS — J45909 Unspecified asthma, uncomplicated: Secondary | ICD-10-CM | POA: Insufficient documentation

## 2014-11-12 LAB — CBC
HEMATOCRIT: 37 % (ref 36.0–46.0)
Hemoglobin: 12.9 g/dL (ref 12.0–15.0)
MCH: 30.9 pg (ref 26.0–34.0)
MCHC: 34.9 g/dL (ref 30.0–36.0)
MCV: 88.5 fL (ref 78.0–100.0)
PLATELETS: 250 10*3/uL (ref 150–400)
RBC: 4.18 MIL/uL (ref 3.87–5.11)
RDW: 13.7 % (ref 11.5–15.5)
WBC: 9.7 10*3/uL (ref 4.0–10.5)

## 2014-11-12 LAB — BASIC METABOLIC PANEL
Anion gap: 7 (ref 5–15)
BUN: 11 mg/dL (ref 6–20)
CHLORIDE: 103 mmol/L (ref 101–111)
CO2: 27 mmol/L (ref 22–32)
CREATININE: 0.75 mg/dL (ref 0.44–1.00)
Calcium: 9.2 mg/dL (ref 8.9–10.3)
GFR calc Af Amer: >60 mL/min (ref >60)
GFR calc non Af Amer: >60 mL/min (ref >60)
Glucose, Bld: 94 mg/dL (ref 65–99)
POTASSIUM: 3.8 mmol/L (ref 3.5–5.1)
Sodium: 137 mmol/L (ref 135–145)

## 2014-11-12 NOTE — ED Notes (Addendum)
Pt states that she has had L sided facial pain that radiates into her chest and left arm x 3 days. Pt states that her pain is 'at her threshold.' Alert and oriented.

## 2014-11-12 NOTE — ED Notes (Signed)
Pt asking to see her lab work, states she is in pain and wants to see her lab work, and if she has an infection she will stay, but if she does not have an infection she will not stay. Nurse explained that until the doctor reviews the lab work and goes over the lab work with the patient.

## 2014-11-12 NOTE — ED Notes (Signed)
Called to take to room  No response noted from lobby 

## 2014-11-12 NOTE — ED Notes (Signed)
Husband called RN to desk to ask why patient hasn't been seen yet because she has chest pain and if she could have pain medication. It was explained to him that the patient had been screened for the CP by doing an EKG and that we would get her some pain medication when the MD could see her as soon as we have a room available for her.

## 2014-11-12 NOTE — ED Notes (Signed)
Called for second time  No response from lobby 

## 2014-11-12 NOTE — ED Notes (Signed)
Patient called x3 to treatment room, no answer. D/C at this time LWBS.

## 2015-01-12 ENCOUNTER — Emergency Department
Admission: EM | Admit: 2015-01-12 | Discharge: 2015-01-12 | Disposition: A | Discharge status: Home / Self Care | Payer: Medicaid Other | Attending: Emergency Medicine | Admitting: Emergency Medicine

## 2015-01-12 ENCOUNTER — Encounter: Payer: Self-pay | Admitting: Emergency Medicine

## 2015-01-12 DIAGNOSIS — Z79899 Other long term (current) drug therapy: Secondary | ICD-10-CM | POA: Insufficient documentation

## 2015-01-12 DIAGNOSIS — R220 Localized swelling, mass and lump, head: Secondary | ICD-10-CM | POA: Diagnosis present

## 2015-01-12 DIAGNOSIS — Z792 Long term (current) use of antibiotics: Secondary | ICD-10-CM | POA: Diagnosis not present

## 2015-01-12 DIAGNOSIS — K0381 Cracked tooth: Secondary | ICD-10-CM | POA: Insufficient documentation

## 2015-01-12 DIAGNOSIS — K0889 Other specified disorders of teeth and supporting structures: Secondary | ICD-10-CM | POA: Diagnosis not present

## 2015-01-12 MED ORDER — BUPIVACAINE HCL (PF) 0.5 % IJ SOLN
10.0000 mL | Freq: Once | INTRAMUSCULAR | Status: AC
  Administered 2015-01-12: 10 mL
  Filled 2015-01-12: qty 10

## 2015-01-12 MED ORDER — OXYCODONE-ACETAMINOPHEN 5-325 MG PO TABS
1.0000 | ORAL_TABLET | Freq: Once | ORAL | Status: AC
  Administered 2015-01-12: 1 via ORAL
  Filled 2015-01-12: qty 1

## 2015-01-12 MED ORDER — PENICILLIN V POTASSIUM 500 MG PO TABS: 500.0000 mg | ORAL_TABLET | Freq: Four times a day (QID) | ORAL | Status: DC

## 2015-01-12 MED ORDER — OXYCODONE-ACETAMINOPHEN 5-325 MG PO TABS: 1.0000 | ORAL_TABLET | ORAL | Status: DC | PRN

## 2015-01-12 MED ORDER — PENICILLIN V POTASSIUM 500 MG PO TABS
500.0000 mg | ORAL_TABLET | Freq: Once | ORAL | Status: AC
  Administered 2015-01-12: 500 mg via ORAL
  Filled 2015-01-12: qty 1

## 2015-01-12 NOTE — ED Notes (Signed)
Medication arrived from pharmacy and MD at bedside with pt.

## 2015-01-12 NOTE — ED Provider Notes (Signed)
[facility] Emergency Department Provider Note    ____________________________________________  Time seen: [id number]  I have reviewed the triage vital signs and the nursing notes.   HISTORY  Chief Complaint Facial Swelling and Otalgia   History limited by: Not Limited   HPI Melanie Jenkins is a 35 y.o. female with history of asthma who presents to the emergency department today because right facial pain and swelling. The patient states that the symptoms have been getting progressively worse since 3 days ago. She states she has a chronically cracked tooth although denies any new or acute trauma to it. She states that since the pain has started in his become severe. It is constant. She states that eating or swallowing makes it worse. She feels that the right side of her face is swelling. She denies any fevers, nausea or vomiting. She states she has had similar symptoms in the past however on the left side. She does not have a Education officer, communitydentist.    Past Medical History  Diagnosis Date  . Asthma     There are no active problems to display for this patient.   Past Surgical History  Procedure Laterality Date  . Cesarean section      Current Outpatient Rx  Name  Route  Sig  Dispense  Refill  . Ascorbic Acid (VITAMIN C PO)   Oral   Take 1 tablet by mouth daily.          Marland Kitchen. doxycycline (VIBRAMYCIN) 100 MG capsule   Oral   Take 1 capsule (100 mg total) by mouth 2 (two) times daily.   20 capsule   0   . GOLDEN SEAL PO   Oral   Take 1 tablet by mouth daily.          Marland Kitchen. HYDROcodone-acetaminophen (NORCO/VICODIN) 5-325 MG per tablet   Oral   Take 1 tablet by mouth every 6 (six) hours as needed for moderate pain.   15 tablet   0   . ibuprofen (ADVIL,MOTRIN) 800 MG tablet   Oral   Take 1 tablet (800 mg total) by mouth every 8 (eight) hours as needed.   21 tablet   0   . magic mouthwash w/lidocaine SOLN   Oral   Take 5 mLs by mouth 4 (four) times daily.    100 mL   0   . metroNIDAZOLE (FLAGYL) 500 MG tablet   Oral   Take 1 tablet (500 mg total) by mouth 3 (three) times daily.   30 tablet   0   . Myrrh OIL   Does not apply   1 each by Does not apply route daily.          Marland Kitchen. Specialty Vitamins Products (ECHINACEA C COMPLETE PO)   Oral   Take 1 tablet by mouth daily.            Allergies Review of patient's allergies indicates no known allergies.  History reviewed. No pertinent family history.  Social History Social History  Substance Use Topics  . Smoking status: Never Smoker   . Smokeless tobacco: None  . Alcohol Use: Yes     Comment: occ    Review of Systems  Constitutional: Negative for fever. Cardiovascular: Negative for chest pain. Respiratory: Negative for shortness of breath. Gastrointestinal: Negative for abdominal pain, vomiting and diarrhea. Neurological: Negative for headaches, focal weakness or numbness.  10-point ROS otherwise negative.  ____________________________________________   PHYSICAL EXAM:  VITAL SIGNS: ED Triage Vitals  Enc Vitals  Group     BP 01/12/15 1727 129/76 mmHg     Pulse Rate 01/12/15 1727 70     Resp 01/12/15 1727 16     Temp 01/12/15 1727 98.5 F (36.9 C)     Temp Source 01/12/15 1727 Oral     SpO2 01/12/15 1727 97 %     Weight 01/12/15 1727 330 lb (149.687 kg)     Height 01/12/15 1727 5\' 4"  (1.626 m)     Head Cir --      Peak Flow --      Pain Score 01/12/15 1716 7   Constitutional: Alert and oriented. Well appearing and in no distress. Eyes: Conjunctivae are normal. PERRL. Normal extraocular movements. ENT   Head: Normocephalic and atraumatic. No obvious assymetry or swelling.    Nose: No congestion/rhinnorhea.   Mouth/Throat: Mucous membranes are moist. Very tender to palpation of the right lower molars.    Neck: No stridor. Hematological/Lymphatic/Immunilogical: No cervical lymphadenopathy. Cardiovascular: Normal rate, regular rhythm.  No murmurs,  rubs, or gallops. Respiratory: Normal respiratory effort without tachypnea nor retractions. Breath sounds are clear and equal bilaterally. No wheezes/rales/rhonchi. Gastrointestinal: Soft and nontender. No distention. There is no CVA tenderness. Genitourinary: Deferred Musculoskeletal: Normal range of motion in all extremities. No joint effusions.  No lower extremity tenderness nor edema. Neurologic:  Normal speech and language. No gross focal neurologic deficits are appreciated.  Skin:  Skin is warm, dry and intact. No rash noted. Psychiatric: Mood and affect are normal. Speech and behavior are normal. Patient exhibits appropriate insight and judgment.  ____________________________________________    LABS (pertinent positives/negatives)  Labs Reviewed - No data to display   ____________________________________________   EKG  None  ____________________________________________    RADIOLOGY  None   ____________________________________________   PROCEDURES  Procedure(s) performed: Dental Nerve Block, see procedure note(s).  Critical Care performed: No  NERVE BLOCK Performed by: Phineas Semen Consent: Verbal consent obtained. Required items: required blood products, implants, devices, and special equipment available  Indication: Dental pain Nerve block body site: Right lower jaw  Preparation: Patient was prepped and draped in the usual sterile fashion. Needle gauge: 25 G Location technique: anatomical landmarks  Local anesthetic: bupivicaine  Anesthetic total: 1.5 ml  Outcome: pain improved Patient tolerance: Patient tolerated the procedure well with no immediate complications.  ____________________________________________   INITIAL IMPRESSION / ASSESSMENT AND PLAN / ED COURSE  Pertinent labs & imaging results that were available during my care of the patient were reviewed by me and considered in my medical decision making (see chart for  details).  Patient presented to the emergency department today with concerns for right face pain and swelling. Patient does have tender right lower molars. No appreciable swelling on my exam. I did attempt a nerve block which gave the patient some relief. Will discharge home with antibiotics, pain medication dental sheet follow-up.  ____________________________________________   FINAL CLINICAL IMPRESSION(S) / ED DIAGNOSES  Final diagnoses:  Pain, dental     Phineas Semen, MD 01/12/15 2152

## 2015-01-12 NOTE — Discharge Instructions (Signed)
Please seek medical attention for any high fevers, chest pain, shortness of breath, change in behavior, persistent vomiting, bloody stool or any other new or concerning symptoms. ° °Dental Pain °Dental pain may be caused by many things, including: °· Tooth decay (cavities or caries). Cavities expose the nerve of your tooth to air and hot or cold temperatures. This can cause pain or discomfort. °· Abscess or infection. A dental abscess is a collection of infected pus from a bacterial infection in the inner part of the tooth (pulp). It usually occurs at the end of the tooth's root. °· Injury. °· An unknown reason (idiopathic). °Your pain may be mild or severe. It may only occur when: °· You are chewing. °· You are exposed to hot or cold temperature. °· You are eating or drinking sugary foods or beverages, such as soda or candy. °Your pain may also be constant. °HOME CARE INSTRUCTIONS °Watch your dental pain for any changes. The following actions may help to lessen any discomfort that you are feeling: °· Take medicines only as directed by your dentist. °· If you were prescribed an antibiotic medicine, finish all of it even if you start to feel better. °· Keep all follow-up visits as directed by your dentist. This is important. °· Do not apply heat to the outside of your face. °· Rinse your mouth or gargle with salt water if directed by your dentist. This helps with pain and swelling. °¨ You can make salt water by adding ¼ tsp of salt to 1 cup of warm water. °· Apply ice to the painful area of your face: °¨ Put ice in a plastic bag. °¨ Place a towel between your skin and the bag. °¨ Leave the ice on for 20 minutes, 2-3 times per day. °· Avoid foods or drinks that cause you pain, such as: °¨ Very hot or very cold foods or drinks. °¨ Sweet or sugary foods or drinks. °SEEK MEDICAL CARE IF: °· Your pain is not controlled with medicines. °· Your symptoms are worse. °· You have new symptoms. °SEEK IMMEDIATE MEDICAL CARE  IF: °· You are unable to open your mouth. °· You are having trouble breathing or swallowing. °· You have a fever. °· Your face, neck, or jaw is swollen. °  °This information is not intended to replace advice given to you by your health care provider. Make sure you discuss any questions you have with your health care provider. °  °Document Released: 12/20/2004 Document Revised: 05/06/2014 Document Reviewed: 12/16/2013 °Elsevier Interactive Patient Education ©2016 [employer] ° °

## 2015-01-12 NOTE — ED Notes (Signed)
Pt to ed with c/o swelling to right side of face since Friday.  Pt with redness noted to right side of face.

## 2015-01-12 NOTE — ED Notes (Signed)
Left facial swelling

## 2015-01-12 NOTE — ED Notes (Signed)
MD at bedside. 

## 2015-03-19 DIAGNOSIS — J4521 Mild intermittent asthma with (acute) exacerbation: Secondary | ICD-10-CM | POA: Insufficient documentation

## 2015-03-24 DIAGNOSIS — F419 Anxiety disorder, unspecified: Secondary | ICD-10-CM | POA: Insufficient documentation

## 2015-05-27 ENCOUNTER — Emergency Department
Admission: EM | Admit: 2015-05-27 | Discharge: 2015-05-28 | Disposition: A | Discharge status: Home / Self Care | Payer: Medicaid Other | Attending: Emergency Medicine | Admitting: Emergency Medicine

## 2015-05-27 ENCOUNTER — Emergency Department: Payer: Medicaid Other

## 2015-05-27 ENCOUNTER — Encounter: Payer: Self-pay | Admitting: Emergency Medicine

## 2015-05-27 DIAGNOSIS — S86811A Strain of other muscle(s) and tendon(s) at lower leg level, right leg, initial encounter: Secondary | ICD-10-CM | POA: Insufficient documentation

## 2015-05-27 DIAGNOSIS — Y939 Activity, unspecified: Secondary | ICD-10-CM | POA: Diagnosis not present

## 2015-05-27 DIAGNOSIS — Z79899 Other long term (current) drug therapy: Secondary | ICD-10-CM | POA: Diagnosis not present

## 2015-05-27 DIAGNOSIS — X58XXXA Exposure to other specified factors, initial encounter: Secondary | ICD-10-CM | POA: Diagnosis not present

## 2015-05-27 DIAGNOSIS — J45909 Unspecified asthma, uncomplicated: Secondary | ICD-10-CM | POA: Insufficient documentation

## 2015-05-27 DIAGNOSIS — Z792 Long term (current) use of antibiotics: Secondary | ICD-10-CM | POA: Diagnosis not present

## 2015-05-27 DIAGNOSIS — M79661 Pain in right lower leg: Secondary | ICD-10-CM

## 2015-05-27 DIAGNOSIS — T148XXA Other injury of unspecified body region, initial encounter: Secondary | ICD-10-CM

## 2015-05-27 DIAGNOSIS — Y999 Unspecified external cause status: Secondary | ICD-10-CM | POA: Diagnosis not present

## 2015-05-27 DIAGNOSIS — Y929 Unspecified place or not applicable: Secondary | ICD-10-CM | POA: Insufficient documentation

## 2015-05-27 DIAGNOSIS — S86901A Unspecified injury of unspecified muscle(s) and tendon(s) at lower leg level, right leg, initial encounter: Secondary | ICD-10-CM | POA: Diagnosis present

## 2015-05-27 LAB — CBC
HEMATOCRIT: 39.6 % (ref 35.0–47.0)
HEMOGLOBIN: 13.3 g/dL (ref 12.0–16.0)
MCH: 29.8 pg (ref 26.0–34.0)
MCHC: 33.6 g/dL (ref 32.0–36.0)
MCV: 88.8 fL (ref 80.0–100.0)
Platelets: 229 10*3/uL (ref 150–440)
RBC: 4.46 MIL/uL (ref 3.80–5.20)
RDW: 14.6 % — ABNORMAL HIGH (ref 11.5–14.5)
WBC: 13.2 10*3/uL — AB (ref 3.6–11.0)

## 2015-05-27 MED ORDER — OXYCODONE-ACETAMINOPHEN 5-325 MG PO TABS
2.0000 | ORAL_TABLET | Freq: Once | ORAL | Status: AC
  Administered 2015-05-27: 2 via ORAL
  Filled 2015-05-27: qty 2

## 2015-05-27 MED ORDER — IBUPROFEN 800 MG PO TABS
800.0000 mg | ORAL_TABLET | Freq: Once | ORAL | Status: AC
Start: 2015-05-28 — End: 2015-05-27
  Administered 2015-05-27: 800 mg via ORAL
  Filled 2015-05-27: qty 1

## 2015-05-27 NOTE — ED Provider Notes (Signed)
[facility] Emergency Department Provider Note   ____________________________________________  Time seen: Approximately 11:36 PM  I have reviewed the triage vital signs and the nursing notes.   HISTORY  Chief Complaint Leg Pain and Leg Swelling    HPI Melanie Jenkins is a 35 y.o. female who presents to the ED from home with a chief complaint of right calf pain. Onset of swelling and pain to the right calf and posterior knee 4 days ago. Patient denies travel or trauma. Has an IUD in place. Denies associated fever, chills, chest pain, abdominal pain, nausea, vomiting, diarrhea. States over the past month she occasionally get short of breath and experiences palpitations; none currently. 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 Denies taking creatine supplements or exertional use of leg. States swelling is baseline for her legs. Nothing makes her symptoms better. Movement and bearing weight make her symptoms worse.   Past Medical History  Diagnosis Date  . Asthma     There are no active problems to display for this patient.   Past Surgical History  Procedure Laterality Date  . Cesarean section      Current Outpatient Rx  Name  Route  Sig  Dispense  Refill  . Ascorbic Acid (VITAMIN C PO)   Oral   Take 1 tablet by mouth daily.          Marland Kitchen doxycycline (VIBRAMYCIN) 100 MG capsule   Oral   Take 1 capsule (100 mg total) by mouth 2 (two) times daily.   20 capsule   0   . GOLDEN SEAL PO   Oral   Take 1 tablet by mouth daily.          Marland Kitchen HYDROcodone-acetaminophen (NORCO/VICODIN) 5-325 MG per tablet   Oral   Take 1 tablet by mouth every 6 (six) hours as needed for moderate pain.   15 tablet   0   . ibuprofen (ADVIL,MOTRIN) 800 MG tablet   Oral   Take 1 tablet (800 mg total) by mouth every 8 (eight) hours as needed for moderate pain.   15 tablet   0   . magic mouthwash w/lidocaine SOLN   Oral   Take 5 mLs by mouth 4 (four)  times daily.   100 mL   0   . metroNIDAZOLE (FLAGYL) 500 MG tablet   Oral   Take 1 tablet (500 mg total) by mouth 3 (three) times daily.   30 tablet   0   . Myrrh OIL   Does not apply   1 each by Does not apply route daily.          Marland Kitchen oxyCODONE-acetaminophen (ROXICET) 5-325 MG tablet   Oral   Take 1 tablet by mouth every 4 (four) hours as needed for severe pain.   15 tablet   0   . penicillin v potassium (VEETID) 500 MG tablet   Oral   Take 1 tablet (500 mg total) by mouth 4 (four) times daily.   40 tablet   0   . Specialty Vitamins Products (ECHINACEA C COMPLETE PO)   Oral   Take 1 tablet by mouth daily.            Allergies Review of patient's allergies indicates no known allergies.  History reviewed. No pertinent family history.  Social History Social History  Substance Use Topics  . Smoking status: Never Smoker   . Smokeless tobacco: None  . Alcohol Use: Yes  Comment: occ    Review of Systems  Constitutional: No fever/chills. Eyes: No visual changes. ENT: No sore throat. Cardiovascular: Denies chest pain. Respiratory: Denies shortness of breath. Gastrointestinal: No abdominal pain.  No nausea, no vomiting.  No diarrhea.  No constipation. Genitourinary: Negative for dysuria. Musculoskeletal: Positive for right leg pain. Negative for back pain. Skin: Negative for rash. Neurological: Negative for headaches, focal weakness or numbness.  10-point ROS otherwise negative.  ____________________________________________   PHYSICAL EXAM:  VITAL SIGNS: ED Triage Vitals  Enc Vitals Group     BP 05/27/15 2217 128/82 mmHg     Pulse Rate 05/27/15 2217 85     Resp 05/27/15 2217 18     Temp 05/27/15 2217 98.1 F (36.7 C)     Temp Source 05/27/15 2217 Oral     SpO2 05/27/15 2217 97 %     Weight 05/27/15 2217 352 lb (159.666 kg)     Height 05/27/15 2217  (1.626 m)     Head Cir --      Peak Flow --      Pain Score 05/27/15 2219 6     Pain  Loc --      Pain Edu? --      Excl. in GC? --     Constitutional: Alert and oriented. Well appearing and in no acute distress. Eyes: Conjunctivae are normal. PERRL. EOMI. Head: Atraumatic. Nose: No congestion/rhinnorhea. Mouth/Throat: Mucous membranes are moist.  Oropharynx non-erythematous. Neck: No stridor.   Cardiovascular: Normal rate, regular rhythm. Grossly normal heart sounds.  Good peripheral circulation. Respiratory: Normal respiratory effort.  No retractions. Lungs CTAB. Gastrointestinal: Morbidly obese. Soft and nontender. No distention. No abdominal bruits. No CVA tenderness. Musculoskeletal: Right calf 57 cm Left calf 56 cm. Right posterior calf tender to palpation. Calf is not tense nor terse; low suspicion for compartment syndrome. Full range of motion of ankle with minimal pain. 2+ BLE nonpitting edema. No joint effusions. 2+ distal pulses. Brisk, less than 5 second capillary refill. Symmetrically warm limbs without evidence for ischemia. Neurologic:  Normal speech and language. No gross focal neurologic deficits are appreciated.  Skin:  Skin is warm, dry and intact. No rash noted. Psychiatric: Mood and affect are normal. Speech and behavior are normal.  ____________________________________________   LABS (all labs ordered are listed, but only abnormal results are displayed)  Labs Reviewed  CBC - Abnormal; Notable for the following:    WBC 13.2 (*)    RDW 14.6 (*)    All other components within normal limits  BASIC METABOLIC PANEL - Abnormal; Notable for the following:    Glucose, Bld 110 (*)    All other components within normal limits  CK  FIBRIN DERIVATIVES D-DIMER ([facility] ONLY)   ____________________________________________  EKG  None ____________________________________________  RADIOLOGY  Doppler ultrasound interpreted per Dr. Micheline Maze:  No evidence of deep venous thrombosis. ____________________________________________   PROCEDURES  Procedure(s)  performed: None  Critical Care performed: No  ____________________________________________   INITIAL IMPRESSION / ASSESSMENT AND PLAN / ED COURSE  Pertinent labs & imaging results that were available during my care of the patient were reviewed by me and considered in my medical decision making (see chart for details).  35 year old female who presents with right lower extremity pain and swelling. Doppler ultrasound is negative for DVT. Will check electrolytes, CK and administer analgesia.  ----------------------------------------- 3:42 AM on 05/28/2015 -----------------------------------------  Delay secondary to other critical patients. Updated patient and family member of laboratory results. Plan for  analgesia, try compression, walker and follow-up with orthopedics as needed. Strict return precautions given. Both verbalize understanding and agree with plan of care. ____________________________________________   FINAL CLINICAL IMPRESSION(S) / ED DIAGNOSES  Final diagnoses:  Calf pain, right  Musculoskeletal strain      NEW MEDICATIONS STARTED DURING THIS VISIT:  New Prescriptions   IBUPROFEN (ADVIL,MOTRIN) 800 MG TABLET    Take 1 tablet (800 mg total) by mouth every 8 (eight) hours as needed for moderate pain.   OXYCODONE-ACETAMINOPHEN (ROXICET) 5-325 MG TABLET    Take 1 tablet by mouth every 4 (four) hours as needed for severe pain.     Note:  This document was prepared using Dragon voice recognition software and may include unintentional dictation errors.    Irean HongJade J Sung, MD 05/28/15 [phone]

## 2015-05-27 NOTE — ED Notes (Signed)
Pt arrived to the ED for complaints of right leg shooting pain. Pt reports that she Is experiencing swelling of the right leg with shooting pain on the calf that radiated to the back of the knee. Pt is AOx4 in mild pain during triage.

## 2015-05-28 LAB — BASIC METABOLIC PANEL
Anion gap: 8 (ref 5–15)
BUN: 14 mg/dL (ref 6–20)
CHLORIDE: 108 mmol/L (ref 101–111)
CO2: 22 mmol/L (ref 22–32)
CREATININE: 0.71 mg/dL (ref 0.44–1.00)
Calcium: 8.9 mg/dL (ref 8.9–10.3)
GFR calc Af Amer: >60 mL/min (ref >60)
GFR calc non Af Amer: >60 mL/min (ref >60)
Glucose, Bld: 110 mg/dL — ABNORMAL HIGH (ref 65–99)
Potassium: 3.6 mmol/L (ref 3.5–5.1)
SODIUM: 138 mmol/L (ref 135–145)

## 2015-05-28 LAB — CK: CK TOTAL: 51 U/L (ref 38–234)

## 2015-05-28 MED ORDER — OXYCODONE-ACETAMINOPHEN 5-325 MG PO TABS: 1.0000 | ORAL_TABLET | ORAL | Status: DC | PRN

## 2015-05-28 MED ORDER — IBUPROFEN 800 MG PO TABS: 800.0000 mg | ORAL_TABLET | Freq: Three times a day (TID) | ORAL | Status: DC | PRN

## 2015-05-28 NOTE — Discharge Instructions (Signed)
1. Take pain medicines as needed (Motrin/Percocet #15). 2. Elevate affected area as often as possible. 3. You may remove Ace wrap as needed. Use walker to help you walk. 4. Return to the ER for worsening symptoms, increased swelling, redness/warmth or other concerns.  Musculoskeletal Pain Musculoskeletal pain is muscle and boney aches and pains. These pains can occur in any part of the body. Your caregiver may treat you without knowing the cause of the pain. They may treat you if blood or urine tests, X-rays, and other tests were normal.  CAUSES There is often not a definite cause or reason for these pains. These pains may be caused by a type of germ (virus). The discomfort may also come from overuse. Overuse includes working out too hard when your body is not fit. Boney aches also come from weather changes. Bone is sensitive to atmospheric pressure changes. HOME CARE INSTRUCTIONS   Ask when your test results will be ready. Make sure you get your test results.  Only take over-the-counter or prescription medicines for pain, discomfort, or fever as directed by your caregiver. If you were given medications for your condition, do not drive, operate machinery or power tools, or sign legal documents for 24 hours. Do not drink alcohol. Do not take sleeping pills or other medications that may interfere with treatment.  Continue all activities unless the activities cause more pain. When the pain lessens, slowly resume normal activities. Gradually increase the intensity and duration of the activities or exercise.  During periods of severe pain, bed rest may be helpful. Lay or sit in any position that is comfortable.  Putting ice on the injured area.  Put ice in a bag.  Place a towel between your skin and the bag.  Leave the ice on for 15 to 20 minutes, 3 to 4 times a day.  Follow up with your caregiver for continued problems and no reason can be found for the pain. If the pain becomes worse or does  not go away, it may be necessary to repeat tests or do additional testing. Your caregiver may need to look further for a possible cause. SEEK IMMEDIATE MEDICAL CARE IF:  You have pain that is getting worse and is not relieved by medications.  You develop chest pain that is associated with shortness or breath, sweating, feeling sick to your stomach (nauseous), or throw up (vomit).  Your pain becomes localized to the abdomen.  You develop any new symptoms that seem different or that concern you. MAKE SURE YOU:   Understand these instructions.  Will watch your condition.  Will get help right away if you are not doing well or get worse.   This information is not intended to replace advice given to you by your health care provider. Make sure you discuss any questions you have with your health care provider.   Document Released: 12/20/2004 Document Revised: 03/14/2011 Document Reviewed: 08/24/2012 Elsevier Interactive Patient Education [employer]2016 Elsevier Inc.

## 2015-08-04 ENCOUNTER — Other Ambulatory Visit: Payer: Self-pay | Admitting: Ophthalmology

## 2015-08-04 DIAGNOSIS — H4710 Unspecified papilledema: Secondary | ICD-10-CM

## 2015-08-25 ENCOUNTER — Ambulatory Visit: Admission: RE | Admit: 2015-08-25 | Payer: Medicaid Other | Source: Ambulatory Visit

## 2015-09-08 ENCOUNTER — Ambulatory Visit: Payer: Medicaid Other

## 2015-10-27 ENCOUNTER — Emergency Department (HOSPITAL_COMMUNITY)
Admission: EM | Admit: 2015-10-27 | Discharge: 2015-10-27 | Disposition: A | Discharge status: Home / Self Care | Payer: Medicaid Other | Attending: Emergency Medicine | Admitting: Emergency Medicine

## 2015-10-27 ENCOUNTER — Emergency Department (HOSPITAL_COMMUNITY): Payer: Medicaid Other

## 2015-10-27 ENCOUNTER — Encounter (HOSPITAL_COMMUNITY): Payer: Self-pay | Admitting: Emergency Medicine

## 2015-10-27 DIAGNOSIS — Z79899 Other long term (current) drug therapy: Secondary | ICD-10-CM | POA: Insufficient documentation

## 2015-10-27 DIAGNOSIS — R002 Palpitations: Secondary | ICD-10-CM | POA: Insufficient documentation

## 2015-10-27 DIAGNOSIS — J45909 Unspecified asthma, uncomplicated: Secondary | ICD-10-CM | POA: Insufficient documentation

## 2015-10-27 DIAGNOSIS — R079 Chest pain, unspecified: Secondary | ICD-10-CM | POA: Diagnosis present

## 2015-10-27 DIAGNOSIS — Z87891 Personal history of nicotine dependence: Secondary | ICD-10-CM | POA: Insufficient documentation

## 2015-10-27 HISTORY — DX: Hypoglycemia, unspecified: E16.2

## 2015-10-27 LAB — CBC
HEMATOCRIT: 35.9 % — AB (ref 36.0–46.0)
HEMOGLOBIN: 12 g/dL (ref 12.0–15.0)
MCH: 30 pg (ref 26.0–34.0)
MCHC: 33.4 g/dL (ref 30.0–36.0)
MCV: 89.8 fL (ref 78.0–100.0)
Platelets: 200 10*3/uL (ref 150–400)
RBC: 4 MIL/uL (ref 3.87–5.11)
RDW: 14.2 % (ref 11.5–15.5)
WBC: 9.8 10*3/uL (ref 4.0–10.5)

## 2015-10-27 LAB — BASIC METABOLIC PANEL
ANION GAP: 9 (ref 5–15)
BUN: 14 mg/dL (ref 6–20)
CALCIUM: 9 mg/dL (ref 8.9–10.3)
CO2: 24 mmol/L (ref 22–32)
Chloride: 104 mmol/L (ref 101–111)
Creatinine, Ser: 0.84 mg/dL (ref 0.44–1.00)
GLUCOSE: 105 mg/dL — AB (ref 65–99)
POTASSIUM: 3.6 mmol/L (ref 3.5–5.1)
Sodium: 137 mmol/L (ref 135–145)

## 2015-10-27 NOTE — ED Provider Notes (Signed)
[facility]-EMERGENCY DEPT Provider Note   CSN: [id number] Arrival date & time: 10/27/15  0815     History   Chief Complaint Chief Complaint  Patient presents with  . Chest Pain    HPI Melanie Jenkins is a 35 y.o. female.  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 A paramedic who was in her class later down on the ground and took her vital signs and at that time as she relaxed her symptoms went away, the palpitations went away. She reports that she has had chest pain every day for approximately 6 months and relates this to using her CPAP machine. She also reports that she has had several episodes of hypoglycemia, she is unsure why she is having it but after changing her diet 2 weeks ago she no longer has that. She denies any cardiac history, is not a diabetic, has no problems with hypertension. At this time the patient feels back to normal. This occurred this morning, it was acute in onset and lasted for less than 2 minutes    Chest Pain      Past Medical History:  Diagnosis Date  . Asthma   . Hypoglycemia     There are no active problems to display for this patient.   Past Surgical History:  Procedure Laterality Date  . CESAREAN SECTION      OB History    Gravida Para Term Preterm AB Living   6 3 3     3    SAB TAB Ectopic Multiple Live Births                   Home Medications    Prior to Admission medications   Medication Sig Start Date End Date Taking? Authorizing Provider  Ascorbic Acid (VITAMIN C PO) Take 1 tablet by mouth daily.     Historical Provider, MD  doxycycline (VIBRAMYCIN) 100 MG capsule Take 1 capsule (100 mg total) by mouth 2 (two) times daily. 04/16/14   Rolland Porter, MD  GOLDEN SEAL PO  Take 1 tablet by mouth daily.     Historical Provider, MD  HYDROcodone-acetaminophen (NORCO/VICODIN) 5-325 MG per tablet Take 1 tablet by mouth every 6 (six) hours as needed for moderate pain. 05/09/14   Charlestine Night, PA-C  ibuprofen (ADVIL,MOTRIN) 800 MG tablet Take 1 tablet (800 mg total) by mouth every 8 (eight) hours as needed for moderate pain. 05/28/15   Irean Hong, MD  magic mouthwash w/lidocaine SOLN Take 5 mLs by mouth 4 (four) times daily. 10/18/14   Joni Reining, PA-C  metroNIDAZOLE (FLAGYL) 500 MG tablet Take 1 tablet (500 mg total) by mouth 3 (three) times daily. 04/16/14   Rolland Porter, MD  Myrrh OIL 1 each by Does not apply route daily.     Historical Provider, MD  oxyCODONE-acetaminophen (ROXICET) 5-325 MG tablet Take 1 tablet by mouth every 4 (four) hours as needed for severe pain. 05/28/15   Irean Hong, MD  penicillin v potassium (VEETID) 500 MG tablet Take 1 tablet (500 mg total) by mouth 4 (four) times daily. 01/12/15   Phineas Semen, MD  Specialty Vitamins Products (ECHINACEA C COMPLETE PO) Take 1 tablet by mouth daily.     Historical Provider,  MD    Family History No family history on file.  Social History Social History  Substance Use Topics  . Smoking status: Former Smoker    Packs/day: 0.50    Years: 8.00    Types: Cigarettes  . Smokeless tobacco: Never Used  . Alcohol use Yes     Comment: occ     Allergies   Review of patient's allergies indicates no known allergies.   Review of Systems Review of Systems  Cardiovascular: Positive for chest pain.  All other systems reviewed and are negative.    Physical Exam Updated Vital Signs Pulse 77   Resp 20   Ht 5\' 4"  (1.626 m)   Wt (!) 370 lb (167.8 kg)   SpO2 100%   BMI 63.51 kg/m   Physical Exam  Constitutional: She appears well-developed and well-nourished. No distress.  HENT:  Head: Normocephalic and atraumatic.  Mouth/Throat: Oropharynx is clear and moist. No oropharyngeal exudate.  Eyes:  Conjunctivae and EOM are normal. Pupils are equal, round, and reactive to light. Right eye exhibits no discharge. Left eye exhibits no discharge. No scleral icterus.  Neck: Normal range of motion. Neck supple. No JVD present. No thyromegaly present.  Cardiovascular: Normal rate, regular rhythm, normal heart sounds and intact distal pulses.  Exam reveals no gallop and no friction rub.   No murmur heard. Pulmonary/Chest: Effort normal and breath sounds normal. No respiratory distress. She has no wheezes. She has no rales.  Abdominal: Soft. Bowel sounds are normal. She exhibits no distension and no mass. There is no tenderness.  Musculoskeletal: Normal range of motion. She exhibits no edema or tenderness.  Lymphadenopathy:    She has no cervical adenopathy.  Neurological: She is alert. Coordination normal.  Skin: Skin is warm and dry. No rash noted. No erythema.  Psychiatric: She has a normal mood and affect. Her behavior is normal.  Nursing note and vitals reviewed.    ED Treatments / Results  Labs (all labs ordered are listed, but only abnormal results are displayed) Labs Reviewed  BASIC METABOLIC PANEL - Abnormal; Notable for the following:       Result Value   Glucose, Bld 105 (*)    All other components within normal limits  CBC - Abnormal; Notable for the following:    HCT 35.9 (*)    All other components within normal limits    EKG  EKG Interpretation  Date/Time:  Tuesday October 27 2015 08:23:17 EDT Ventricular Rate:  78 PR Interval:    QRS Duration: 99 QT Interval:  364 QTC Calculation: 415 R Axis:   21 Text Interpretation:  Sinus rhythm Normal ECG since last tracing no significant change Confirmed by Hyacinth Meeker  MD, Wally Shevchenko ([id number]) on 10/27/2015 8:45:21 AM       Radiology Dg Chest 2 View  Result Date: 10/27/2015 CLINICAL DATA:  Shortness of breath this morning, heart flutter EXAM: CHEST  2 VIEW COMPARISON:  11/12/2014 FINDINGS: Cardiomediastinal silhouette is stable.  No acute infiltrate or pulmonary edema. Bony thorax is unremarkable. IMPRESSION: No active cardiopulmonary disease. Electronically Signed   By: Natasha Mead M.D.   On: 10/27/2015 09:22    Procedures Procedures (including critical care time)  Medications Ordered in ED Medications - No data to display   Initial Impression / Assessment and Plan / ED Course  I have reviewed the triage vital signs and the nursing notes.  Pertinent labs & imaging results that were available during my care of the patient were reviewed by  me and considered in my medical decision making (see chart for details).  Clinical Course    The patient appears well, she has a normal cardiac exam and a normal EKG without ectopy or QT prolongation. We'll obtain several blood labs, this does not sound cardiac in etiology, she has no shortness of breath hypoxia or tachycardia to suggest a pulmonary embolism, this could be related to anxiety in relation to her test, check for hypoglycemia, anemia or hypokalemia. The patient is in agreement with the plan. Mental status is totally normal and exam is totally normal and well appearing.  The pt has no arrhythmia since arrival, Labs are reassuring and normal inculding glucose, electrolytes and CXR normal as well. ecg normal Informed of results Stable for d/c.  Final Clinical Impressions(s) / ED Diagnoses   Final diagnoses:  Palpitations    New Prescriptions New Prescriptions   No medications on file     Eber HongBrian Javen Ridings, MD 10/27/15 1021

## 2015-10-27 NOTE — ED Notes (Signed)
MD at bedside. 

## 2015-10-27 NOTE — ED Notes (Signed)
Pt states that her left ankle/foot has been swelling x2 days. Pt states that is usually both her legs that swell but she has noticed that the left one is more swollen that usual.

## 2015-10-27 NOTE — ED Triage Notes (Signed)
Pt states that she is usually short of breathe but today she did notice that it took her a little longer to get to class today.

## 2015-10-27 NOTE — ED Notes (Signed)
Patient transported to X-ray 

## 2015-10-27 NOTE — ED Notes (Signed)
Patient transported back from X-ray 

## 2015-10-27 NOTE — ED Triage Notes (Addendum)
  Pt experienced fluttering in chest 30 mins [facility] Pt complains of Chest Pain x2 weeks Pressure in chest radiating to back  Pain originally rated 5/10 but none reported now  HX: CPAP, Asthma, Hypoglymia  Pt states that she didn't not wear her CPAP last night   Pt was able to walk from stretcher in the hallway to stretcher in the room and to the bathroom without difficulty.   2 nitro and 324mg  ASA given in EMS  EMS reports:  NSR HR 150 at scene  20 R AC BP: 125/84 HR: 90 O2: 99

## 2016-03-02 DIAGNOSIS — R002 Palpitations: Secondary | ICD-10-CM | POA: Insufficient documentation

## 2016-03-07 DIAGNOSIS — R0789 Other chest pain: Secondary | ICD-10-CM | POA: Insufficient documentation

## 2016-05-20 DIAGNOSIS — G4733 Obstructive sleep apnea (adult) (pediatric): Secondary | ICD-10-CM | POA: Insufficient documentation

## 2016-11-07 ENCOUNTER — Emergency Department: Payer: Medicaid Other

## 2016-11-07 ENCOUNTER — Other Ambulatory Visit: Payer: Self-pay

## 2016-11-07 ENCOUNTER — Emergency Department
Admission: EM | Admit: 2016-11-07 | Discharge: 2016-11-07 | Disposition: A | Discharge status: Home / Self Care | Payer: Medicaid Other | Attending: Emergency Medicine | Admitting: Emergency Medicine

## 2016-11-07 ENCOUNTER — Encounter: Payer: Self-pay | Admitting: *Deleted

## 2016-11-07 DIAGNOSIS — J45909 Unspecified asthma, uncomplicated: Secondary | ICD-10-CM | POA: Diagnosis not present

## 2016-11-07 DIAGNOSIS — N3001 Acute cystitis with hematuria: Secondary | ICD-10-CM | POA: Diagnosis not present

## 2016-11-07 DIAGNOSIS — R300 Dysuria: Secondary | ICD-10-CM | POA: Diagnosis present

## 2016-11-07 DIAGNOSIS — Z87891 Personal history of nicotine dependence: Secondary | ICD-10-CM | POA: Diagnosis not present

## 2016-11-07 LAB — COMPREHENSIVE METABOLIC PANEL
ALT: 14 U/L (ref 14–54)
AST: 20 U/L (ref 15–41)
Albumin: 3.8 g/dL (ref 3.5–5.0)
Alkaline Phosphatase: 46 U/L (ref 38–126)
Anion gap: 6 (ref 5–15)
BUN: 12 mg/dL (ref 6–20)
CHLORIDE: 104 mmol/L (ref 101–111)
CO2: 26 mmol/L (ref 22–32)
CREATININE: 0.74 mg/dL (ref 0.44–1.00)
Calcium: 9.2 mg/dL (ref 8.9–10.3)
GFR calc Af Amer: >60 mL/min (ref >60)
GFR calc non Af Amer: >60 mL/min (ref >60)
Glucose, Bld: 100 mg/dL — ABNORMAL HIGH (ref 65–99)
Potassium: 4 mmol/L (ref 3.5–5.1)
SODIUM: 136 mmol/L (ref 135–145)
Total Bilirubin: 1 mg/dL (ref 0.3–1.2)
Total Protein: 7.6 g/dL (ref 6.5–8.1)

## 2016-11-07 LAB — CBC WITH DIFFERENTIAL/PLATELET
BASOS ABS: 0 10*3/uL (ref 0–0.1)
Basophils Relative: 0 %
EOS ABS: 0.1 10*3/uL (ref 0–0.7)
EOS PCT: 1 %
HCT: 38.5 % (ref 35.0–47.0)
Hemoglobin: 12.7 g/dL (ref 12.0–16.0)
Lymphocytes Relative: 12 %
Lymphs Abs: 1.7 10*3/uL (ref 1.0–3.6)
MCH: 29.3 pg (ref 26.0–34.0)
MCHC: 33.1 g/dL (ref 32.0–36.0)
MCV: 88.7 fL (ref 80.0–100.0)
Monocytes Absolute: 0.6 10*3/uL (ref 0.2–0.9)
Monocytes Relative: 4 %
Neutro Abs: 12 10*3/uL — ABNORMAL HIGH (ref 1.4–6.5)
Neutrophils Relative %: 83 %
PLATELETS: 229 10*3/uL (ref 150–440)
RBC: 4.34 MIL/uL (ref 3.80–5.20)
RDW: 14.3 % (ref 11.5–14.5)
WBC: 14.5 10*3/uL — AB (ref 3.6–11.0)

## 2016-11-07 LAB — URINALYSIS, COMPLETE (UACMP) WITH MICROSCOPIC
BACTERIA UA: NONE SEEN
BILIRUBIN URINE: NEGATIVE
Glucose, UA: NEGATIVE mg/dL
Ketones, ur: NEGATIVE mg/dL
Nitrite: NEGATIVE
Protein, ur: 100 mg/dL — AB
SPECIFIC GRAVITY, URINE: 1.012 (ref 1.005–1.030)
pH: 5 (ref 5.0–8.0)

## 2016-11-07 LAB — PREGNANCY, URINE: PREG TEST UR: NEGATIVE

## 2016-11-07 MED ORDER — SULFAMETHOXAZOLE-TRIMETHOPRIM 800-160 MG PO TABS
1.0000 | ORAL_TABLET | Freq: Once | ORAL | Status: AC
  Administered 2016-11-07: 1 via ORAL
  Filled 2016-11-07: qty 1

## 2016-11-07 MED ORDER — OXYCODONE-ACETAMINOPHEN 5-325 MG PO TABS: 1.0000 | ORAL_TABLET | Freq: Four times a day (QID) | ORAL | 0 refills | Status: DC | PRN

## 2016-11-07 MED ORDER — PHENAZOPYRIDINE HCL 200 MG PO TABS
200.0000 mg | ORAL_TABLET | Freq: Once | ORAL | Status: AC
  Administered 2016-11-07: 200 mg via ORAL
  Filled 2016-11-07: qty 1

## 2016-11-07 MED ORDER — PHENAZOPYRIDINE HCL 200 MG PO TABS: 200.0000 mg | ORAL_TABLET | Freq: Three times a day (TID) | ORAL | 0 refills | Status: AC | PRN

## 2016-11-07 MED ORDER — SULFAMETHOXAZOLE-TRIMETHOPRIM 800-160 MG PO TABS: 1.0000 | ORAL_TABLET | Freq: Two times a day (BID) | ORAL | 0 refills | Status: DC

## 2016-11-07 NOTE — ED Provider Notes (Signed)
[facility] Emergency Department Provider Note       Time seen: ----------------------------------------- 12:30 PM on 11/07/2016 -----------------------------------------    I have reviewed the triage vital signs and the nursing notes.  HISTORY   Chief Complaint Dysuria    HPI Melanie Jenkins is a 36 y.o. female with a history of asthma who presents to the ED for dysuria, urinary frequency with bladder spasms and hematuria starting yesterday.  Patient states it has been a long time since she has had a UTI.  She was concerned about the amount of blood she was passing in her urine.  She denies any back pain or nausea.  She denies fevers, chills or other complaints.  Past Medical History:  Diagnosis Date  . Asthma   . Hypoglycemia     There are no active problems to display for this patient.   Past Surgical History:  Procedure Laterality Date  . CESAREAN SECTION      Allergies Patient has no known allergies.  Social History Social History   Tobacco Use  . Smoking status: Former Smoker    Packs/day: 0.50    Years: 8.00    Pack years: 4.00    Types: Cigarettes  . Smokeless tobacco: Never Used  Substance Use Topics  . Alcohol use: Yes    Comment: occ  . Drug use: No    Review of Systems Constitutional: Negative for fever. Cardiovascular: Negative for chest pain. Respiratory: Negative for shortness of breath. Gastrointestinal: Negative for abdominal pain, vomiting and diarrhea. Genitourinary: Positive for dysuria, polyuria Musculoskeletal: Negative for back pain. Skin: Negative for rash. Neurological: Negative for headaches, focal weakness or numbness.  All systems negative/normal/unremarkable except as stated in the HPI  ____________________________________________   PHYSICAL EXAM:  VITAL SIGNS: ED Triage Vitals  Enc Vitals Group     BP 11/07/16 0941 (!) 141/86     Pulse Rate 11/07/16 0941 79     Resp 11/07/16 0941 20   Temp 11/07/16 0941 99.5 F (37.5 C)     Temp Source 11/07/16 0941 Oral     SpO2 11/07/16 0941 98 %     Weight 11/07/16 0954 (!) 350 lb (158.8 kg)     Height 11/07/16 0954 5\' 5"  (1.651 m)     Head Circumference --      Peak Flow --      Pain Score 11/07/16 0954 8     Pain Loc --      Pain Edu? --      Excl. in GC? --    Constitutional: Alert and oriented. Well appearing and in no distress. Eyes: Conjunctivae are normal. Normal extraocular movements. Cardiovascular: Normal rate, regular rhythm. No murmurs, rubs, or gallops. Respiratory: Normal respiratory effort without tachypnea nor retractions. Breath sounds are clear and equal bilaterally. No wheezes/rales/rhonchi. Gastrointestinal: Suprapubic tenderness, normal bowel sounds Musculoskeletal: Nontender with normal range of motion in extremities. No lower extremity tenderness nor edema. Neurologic:  Normal speech and language. No gross focal neurologic deficits are appreciated.  Skin:  Skin is warm, dry and intact. No rash noted.  ____________________________________________  ED COURSE:  Pertinent labs & imaging results that were available during my care of the patient were reviewed by me and considered in my medical decision making (see chart for details). Patient presents for dysuria, we will assess with labs and imaging as indicated.   Procedures ____________________________________________   LABS (pertinent positives/negatives)  Labs Reviewed  URINALYSIS, COMPLETE (UACMP) WITH MICROSCOPIC - Abnormal; Notable for the  following components:      Result Value   Color, Urine YELLOW (*)    APPearance CLOUDY (*)    Hgb urine dipstick LARGE (*)    Protein, ur 100 (*)    Leukocytes, UA LARGE (*)    Squamous Epithelial / LPF 0-5 (*)    All other components within normal limits  CBC WITH DIFFERENTIAL/PLATELET - Abnormal; Notable for the following components:   WBC 14.5 (*)    Neutro Abs 12.0 (*)    All other components within  normal limits  COMPREHENSIVE METABOLIC PANEL - Abnormal; Notable for the following components:   Glucose, Bld 100 (*)    All other components within normal limits  URINE CULTURE  PREGNANCY, URINE    RADIOLOGY  CT renal protocol IMPRESSION: 1. No acute abnormality involving the abdomen or pelvis. 2. No evidence of urinary tract calculi on either side. 3. Borderline to mild hepatomegaly and mild splenomegaly. 4. Small hiatal hernia. 5. Umbilical hernia containing fat.  ____________________________________________  DIFFERENTIAL DIAGNOSIS   UTI, pyelonephritis, kidney stone, renal cell carcinoma  FINAL ASSESSMENT AND PLAN  UTI, hematuria secondary to UTI   Plan: Patient had presented for hematuria and symptoms of UTI. Patient's labs do indicate UTI but otherwise are unremarkable. Patient's imaging was reassuring.  She will be discharged with Pyridium, Septra and outpatient follow-up in 1-2 days for recheck.   Emily Filbert, MD   Note: This note was generated in part or whole with voice recognition software. Voice recognition is usually quite accurate but there are transcription errors that can and very often do occur. I apologize for any typographical errors that were not detected and corrected.     Emily Filbert, MD 11/07/16 [phone]

## 2016-11-07 NOTE — ED Triage Notes (Signed)
Pt complains of dysuria, urinary frequency, lower bladder spasms, pt reports hematuria starting yesterday

## 2016-11-07 NOTE — ED Notes (Signed)
Patient verbalized understanding of discharge instructions and follow-up care. Ambulatory to lobby with NAD noted.  

## 2016-11-10 LAB — URINE CULTURE: Special Requests: NORMAL

## 2018-05-30 ENCOUNTER — Emergency Department
Admission: EM | Admit: 2018-05-30 | Discharge: 2018-05-30 | Disposition: A | Discharge status: Home / Self Care | Payer: Self-pay | Attending: Emergency Medicine | Admitting: Emergency Medicine

## 2018-05-30 ENCOUNTER — Other Ambulatory Visit: Payer: Self-pay

## 2018-05-30 ENCOUNTER — Emergency Department: Payer: Self-pay

## 2018-05-30 DIAGNOSIS — G44209 Tension-type headache, unspecified, not intractable: Secondary | ICD-10-CM | POA: Insufficient documentation

## 2018-05-30 DIAGNOSIS — I10 Essential (primary) hypertension: Secondary | ICD-10-CM | POA: Insufficient documentation

## 2018-05-30 DIAGNOSIS — J45909 Unspecified asthma, uncomplicated: Secondary | ICD-10-CM | POA: Insufficient documentation

## 2018-05-30 DIAGNOSIS — Z79899 Other long term (current) drug therapy: Secondary | ICD-10-CM | POA: Insufficient documentation

## 2018-05-30 DIAGNOSIS — Z87891 Personal history of nicotine dependence: Secondary | ICD-10-CM | POA: Insufficient documentation

## 2018-05-30 LAB — CBC
HCT: 38.3 % (ref 36.0–46.0)
Hemoglobin: 12.8 g/dL (ref 12.0–15.0)
MCH: 29.7 pg (ref 26.0–34.0)
MCHC: 33.4 g/dL (ref 30.0–36.0)
MCV: 88.9 fL (ref 80.0–100.0)
Platelets: 229 10*3/uL (ref 150–400)
RBC: 4.31 MIL/uL (ref 3.87–5.11)
RDW: 13.8 % (ref 11.5–15.5)
WBC: 13.8 10*3/uL — ABNORMAL HIGH (ref 4.0–10.5)
nRBC: 0 % (ref 0.0–0.2)

## 2018-05-30 LAB — URINALYSIS, COMPLETE (UACMP) WITH MICROSCOPIC
Bacteria, UA: NONE SEEN
Bilirubin Urine: NEGATIVE
Glucose, UA: NEGATIVE mg/dL
Hgb urine dipstick: NEGATIVE
Ketones, ur: 5 mg/dL — AB
Leukocytes,Ua: NEGATIVE
Nitrite: NEGATIVE
Protein, ur: NEGATIVE mg/dL
Specific Gravity, Urine: 1.012 (ref 1.005–1.030)
pH: 7 (ref 5.0–8.0)

## 2018-05-30 LAB — BASIC METABOLIC PANEL
Anion gap: 9 (ref 5–15)
BUN: 14 mg/dL (ref 6–20)
CO2: 27 mmol/L (ref 22–32)
Calcium: 9.3 mg/dL (ref 8.9–10.3)
Chloride: 102 mmol/L (ref 98–111)
Creatinine, Ser: 0.7 mg/dL (ref 0.44–1.00)
GFR calc Af Amer: >60 mL/min (ref >60)
GFR calc non Af Amer: >60 mL/min (ref >60)
Glucose, Bld: 94 mg/dL (ref 70–99)
Potassium: 3.9 mmol/L (ref 3.5–5.1)
Sodium: 138 mmol/L (ref 135–145)

## 2018-05-30 LAB — POCT PREGNANCY, URINE: Preg Test, Ur: NEGATIVE

## 2018-05-30 MED ORDER — BUTALBITAL-APAP-CAFFEINE 50-325-40 MG PO TABS: 1.0000 | ORAL_TABLET | Freq: Four times a day (QID) | ORAL | 0 refills | Status: AC | PRN

## 2018-05-30 MED ORDER — KETOROLAC TROMETHAMINE 30 MG/ML IJ SOLN
30.0000 mg | Freq: Once | INTRAMUSCULAR | Status: DC
  Filled 2018-05-30: qty 1

## 2018-05-30 MED ORDER — SODIUM CHLORIDE 0.9% FLUSH: 3.0000 mL | Freq: Once | INTRAVENOUS | Status: DC

## 2018-05-30 NOTE — ED Provider Notes (Signed)
[facility] Emergency Department Provider Note   ____________________________________________    I have reviewed the triage vital signs and the nursing notes.   HISTORY  Chief Complaint Headache     HPI Melanie Jenkins is a 38 y.o. female who presents with complaints of intermittent headaches over the last 3 to 4 weeks.  She thinks this may be related to elevated blood pressure, she does not take any prescription medications for blood pressure but reports historically this been relatively normal.  She has been taking multiple herbal-based medications.  She denies neuro deficits.  She reports occasionally she hears a "whooshing sound" in her left ear, her headache is on the right.  It is dull and aching.  Currently headache is resolved  Past Medical History:  Diagnosis Date  . Asthma   . Hypoglycemia     There are no active problems to display for this patient.   Past Surgical History:  Procedure Laterality Date  . CESAREAN SECTION      Prior to Admission medications   Medication Sig Start Date End Date Taking? Authorizing Provider  Ascorbic Acid (VITAMIN C PO) Take 1 tablet by mouth daily.     [provider]  butalbital-acetaminophen-caffeine (FIORICET) [phone] MG tablet Take 1-2 tablets by mouth every 6 (six) hours as needed for headache. 05/30/18 05/30/19  Jene Every, MD  doxycycline (VIBRAMYCIN) 100 MG capsule Take 1 capsule (100 mg total) by mouth 2 (two) times daily. Patient not taking: Reported on 10/27/2015 04/16/14   Rolland Porter, MD  HYDROcodone-acetaminophen (NORCO/VICODIN) 5-325 MG per tablet Take 1 tablet by mouth every 6 (six) hours as needed for moderate pain. Patient not taking: Reported on 10/27/2015 05/09/14   Charlestine Night, PA-C  ibuprofen (ADVIL,MOTRIN) 800 MG tablet Take 1 tablet (800 mg total) by mouth every 8 (eight) hours as needed for moderate pain. Patient not taking: Reported on 10/27/2015 05/28/15    Irean Hong, MD  ipratropium-albuterol (DUONEB) 0.5-2.5 (3) MG/3ML SOLN Take 3 mLs by nebulization every 6 (six) hours as needed (wheezing).    [provider]  magic mouthwash w/lidocaine SOLN Take 5 mLs by mouth 4 (four) times daily. Patient not taking: Reported on 10/27/2015 10/18/14   Joni Reining, PA-C  metroNIDAZOLE (FLAGYL) 500 MG tablet Take 1 tablet (500 mg total) by mouth 3 (three) times daily. Patient not taking: Reported on 10/27/2015 04/16/14   Rolland Porter, MD  oxyCODONE-acetaminophen (PERCOCET) 5-325 MG tablet Take 1-2 tablets every 6 (six) hours as needed by mouth. 11/07/16   Emily Filbert, MD  oxyCODONE-acetaminophen (ROXICET) 5-325 MG tablet Take 1 tablet by mouth every 4 (four) hours as needed for severe pain. Patient not taking: Reported on 10/27/2015 05/28/15   Irean Hong, MD  penicillin v potassium (VEETID) 500 MG tablet Take 1 tablet (500 mg total) by mouth 4 (four) times daily. Patient not taking: Reported on 10/27/2015 01/12/15   Phineas Semen, MD  sulfamethoxazole-trimethoprim (BACTRIM DS) 800-160 MG tablet Take 1 tablet 2 (two) times daily by mouth. 11/07/16   Emily Filbert, MD     Allergies Patient has no known allergies.  No family history on file.  Social History Social History   Tobacco Use  . Smoking status: Former Smoker    Packs/day: 0.50    Years: 8.00    Pack years: 4.00    Types: Cigarettes  . Smokeless tobacco: Never Used  Substance Use Topics  . Alcohol use: Yes  Comment: occ  . Drug use: No    Review of Systems  Constitutional: No fever/chills Eyes: No visual changes.  ENT: No neck pain Cardiovascular: Denies chest pain. Respiratory: Denies shortness of breath. Gastrointestinal: No abdominal pain.  No nausea, no vomiting.   Genitourinary: Negative for dysuria. Musculoskeletal: Negative for back pain. Skin: Negative for rash. Neurological: As above   ____________________________________________    PHYSICAL EXAM:  VITAL SIGNS: ED Triage Vitals  Enc Vitals Group     BP 05/30/18 1722 (!) 147/94     Pulse Rate 05/30/18 1722 91     Resp 05/30/18 1722 17     Temp 05/30/18 1722 98.7 F (37.1 C)     Temp Source 05/30/18 1722 Oral     SpO2 05/30/18 1722 99 %     Weight 05/30/18 1723 (!) 176.9 kg (390 lb)     Height 05/30/18 1723 1.651 m ( )     Head Circumference --      Peak Flow --      Pain Score 05/30/18 1723 6     Pain Loc --      Pain Edu? --      Excl. in GC? --     Constitutional: Alert and oriented. No acute distress. Pleasant and interactive Eyes: Conjunctivae are normal.  PERRLA Head: Atraumatic. Nose: No congestion/rhinnorhea. Mouth/Throat: Mucous membranes are moist.    Cardiovascular: Normal rate, regular rhythm. Grossly normal heart sounds.  Good peripheral circulation. Respiratory: Normal respiratory effort.  No retractions. Lungs CTAB. Gastrointestinal: Soft and nontender. No distention.  No CVA tenderness.  Musculoskeletal: Warm and well perfused Neurologic:  Normal speech and language. No gross focal neurologic deficits are appreciated.  Skin:  Skin is warm, dry and intact. No rash noted. Psychiatric: Mood and affect are normal. Speech and behavior are normal.  ____________________________________________   LABS (all labs ordered are listed, but only abnormal results are displayed)  Labs Reviewed  CBC - Abnormal; Notable for the following components:      Result Value   WBC 13.8 (*)    All other components within normal limits  URINALYSIS, COMPLETE (UACMP) WITH MICROSCOPIC - Abnormal; Notable for the following components:   Color, Urine YELLOW (*)    APPearance HAZY (*)    Ketones, ur 5 (*)    All other components within normal limits  BASIC METABOLIC PANEL  CBG MONITORING, ED  POC URINE PREG, ED  POCT PREGNANCY, URINE   ____________________________________________  EKG  ED ECG REPORT I, Jene Every, the attending physician,  personally viewed and interpreted this ECG.  Date: 05/30/2018  Rhythm: normal sinus rhythm QRS Axis: normal Intervals: normal ST/T Wave abnormalities: normal Narrative Interpretation: no evidence of acute ischemia  ____________________________________________  RADIOLOGY  CT head unremarkable ____________________________________________   PROCEDURES  Procedure(s) performed: No  Procedures   Critical Care performed: No ____________________________________________   INITIAL IMPRESSION / ASSESSMENT AND PLAN / ED COURSE  Pertinent labs & imaging results that were available during my care of the patient were reviewed by me and considered in my medical decision making (see chart for details).  Given patient's headache x3 weeks and whooshing sound in her left ear I recommended that we obtain a CTA of her brain to rule out aneurysm however she refused because "contrast is toxic "she asked for a Noncon scan despite my informing her that it would not show an aneurysm.  Regardless she was relieved when her CT was normal.  Given that she is asymptomatic  and well-appearing with no neuro deficits and blood pressure is only mildly elevated she is appropriate for discharge with close outpatient follow-up.    ____________________________________________   FINAL CLINICAL IMPRESSION(S) / ED DIAGNOSES  Final diagnoses:  Acute non intractable tension-type headache  Essential hypertension        Note:  This document was prepared using Dragon voice recognition software and may include unintentional dictation errors.   Jene EveryKinner, Braylie Badami, MD 05/30/18 2213

## 2018-05-30 NOTE — ED Triage Notes (Signed)
Pt states she has been having HA for the past couple of weeks and noticed her b/p has been elevated, whooshing noise in her ears. States it is normally 120/70.

## 2018-05-30 NOTE — ED Notes (Signed)
Patient declines IM Toradol. Reports not in excruciating pain and rather not have any pharmaceuticals.

## 2019-05-29 IMAGING — CT CT HEAD WITHOUT CONTRAST
3 of 4 series · 15 of 47 positions shown, 18 images · non-contrast
Comparison: None.

CLINICAL DATA: Headache for approximately 2 weeks.

EXAM:
CT HEAD WITHOUT CONTRAST
TECHNIQUE: Contiguous axial images were obtained from the base of the skull
through the vertex without intravenous contrast.

[Series 2: head wo · axial · 0.43mm/px · z∈[-127,-7]mm · 9 of 30 slices shown, 12 images]
[im 3/30  brain]
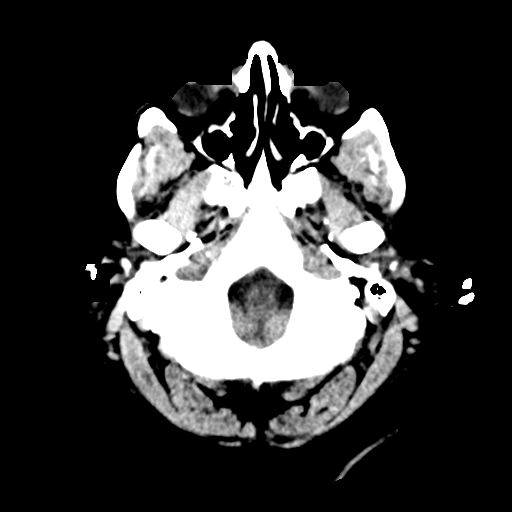
[im 3/30  bone]
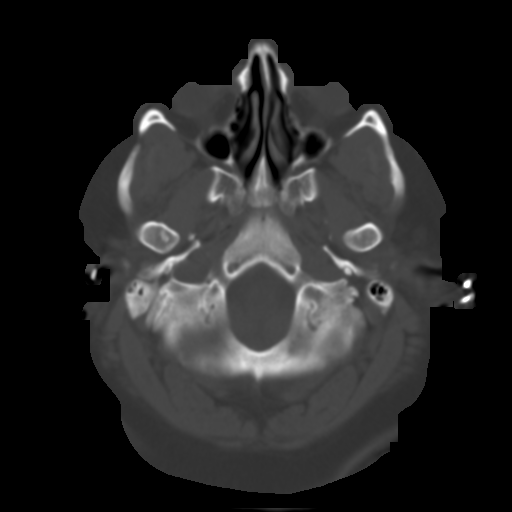
[im 7/30  brain]
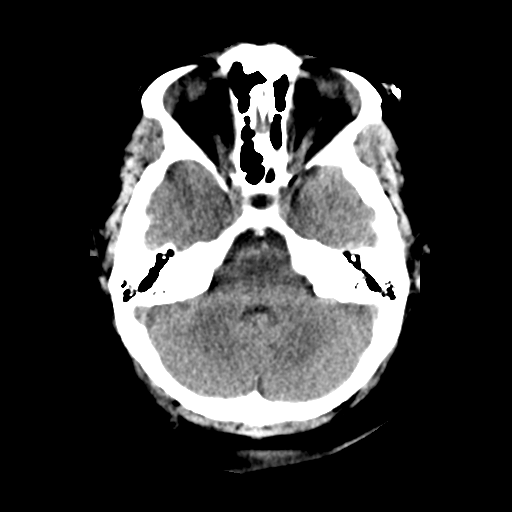
[im 9/30  brain]
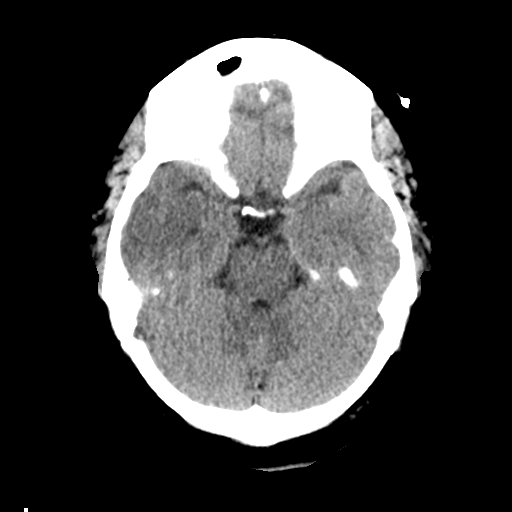
[im 13/30  brain]
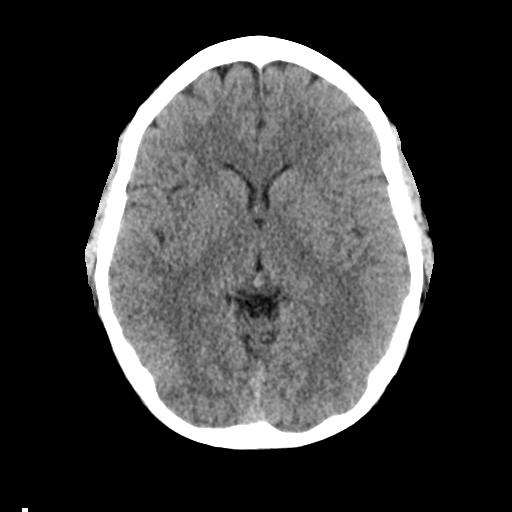
[im 15/30  brain]
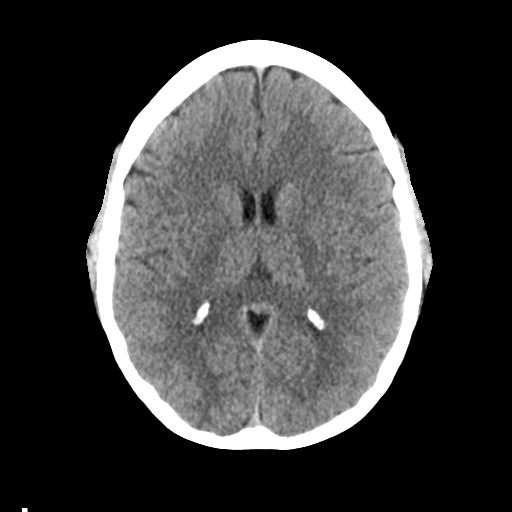
[im 15/30  bone]
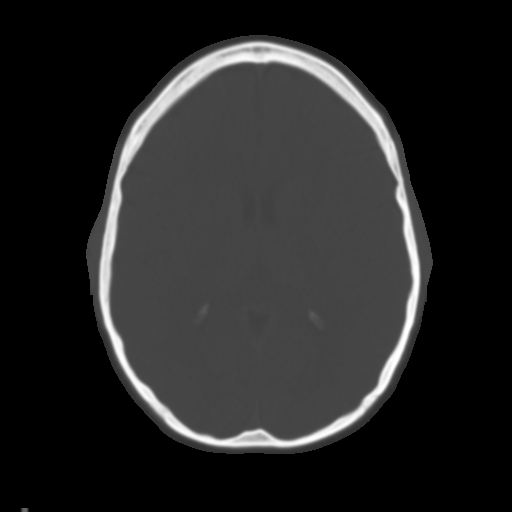
[im 17/30  brain]
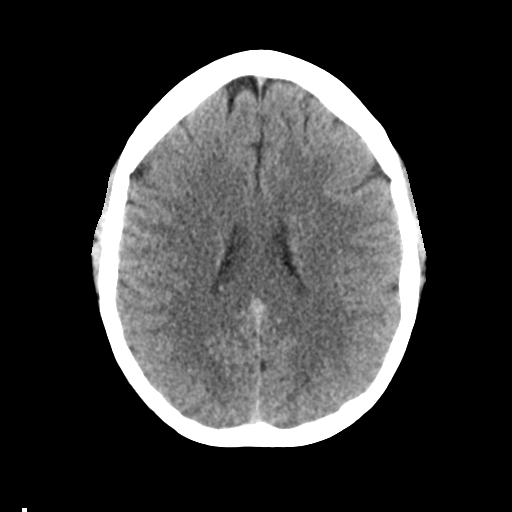
[im 21/30  brain]
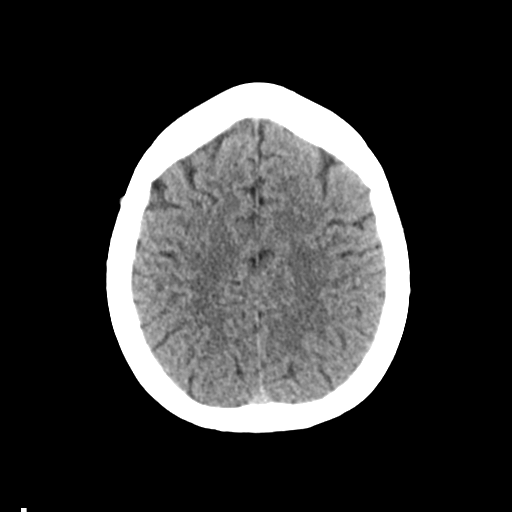
[im 23/30  brain]
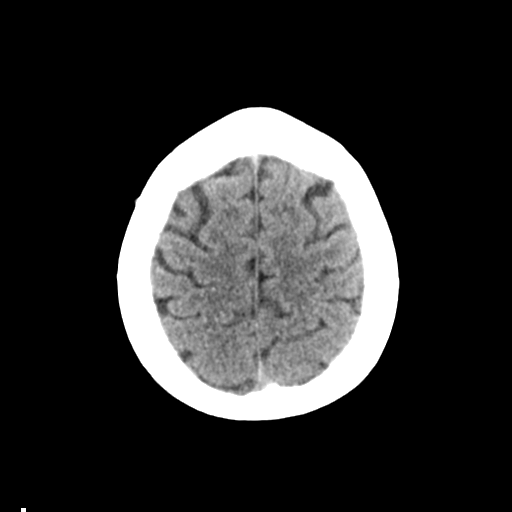
[im 27/30  brain]
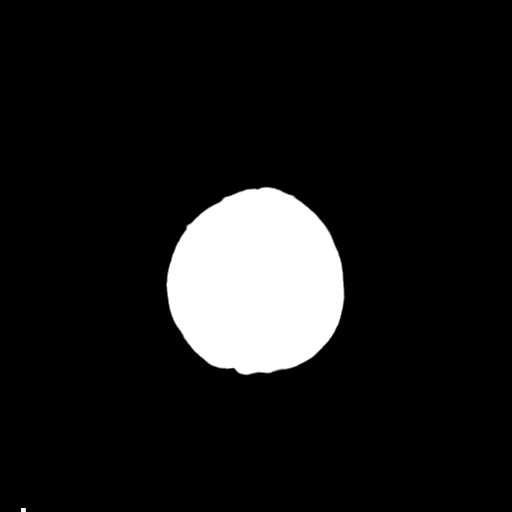
[im 27/30  bone]
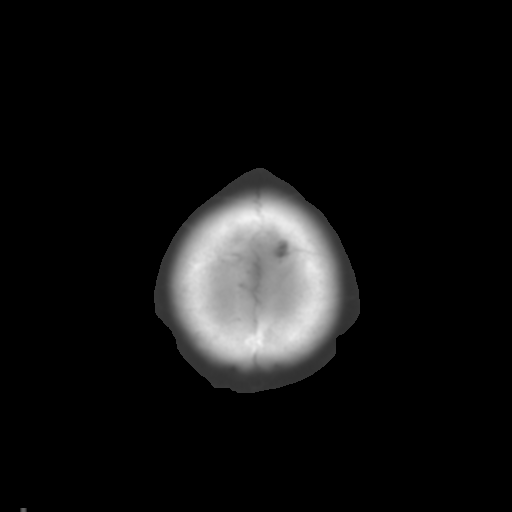

[Series 4: coronal soft tissue · coronal · 0.29mm/px · 3 of 60 slices shown]
[im 20/60  brain]
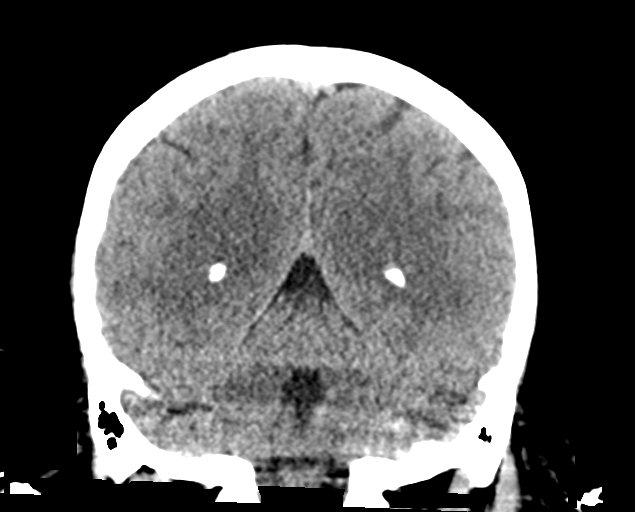
[im 27/60  brain]
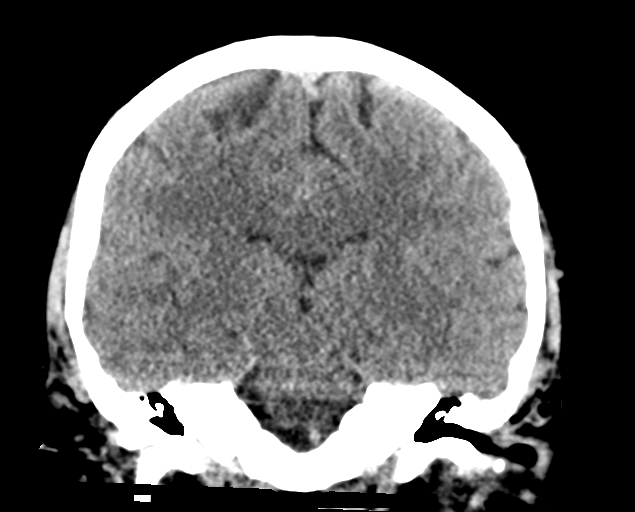
[im 33/60  brain]
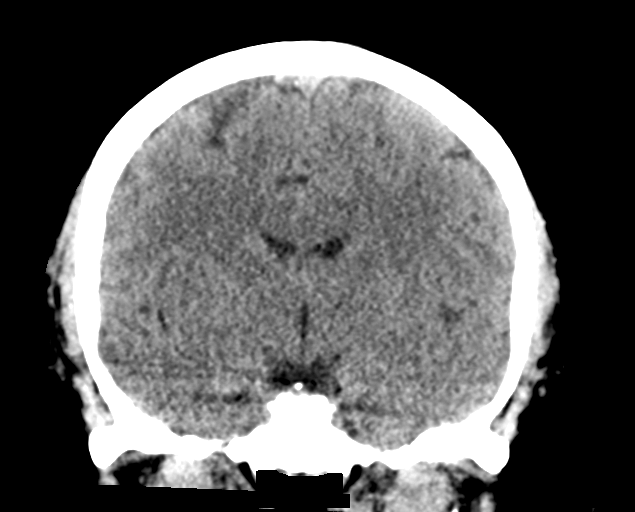

[Series 5: sagittal soft tissue · sagittal · 0.28mm/px · 3 of 53 slices shown]
[im 18/53  brain]
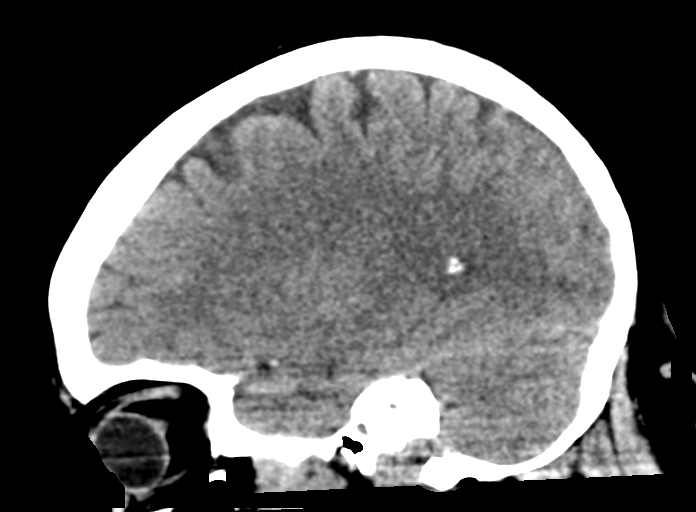
[im 27/53  brain]
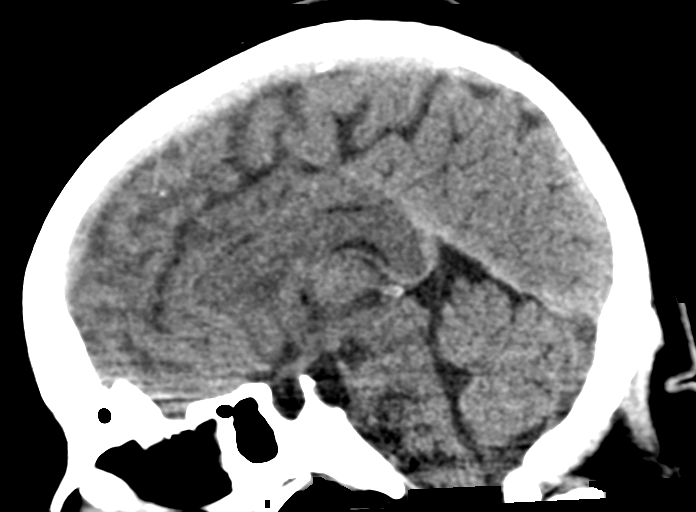
[im 35/53  brain]
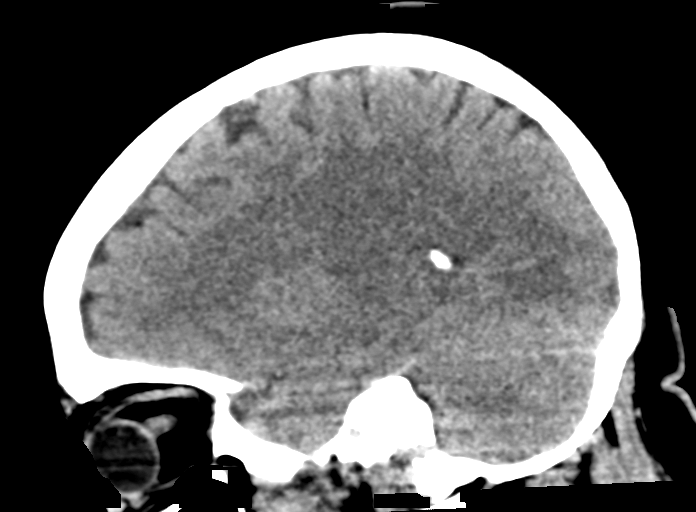

[15 of 47 positions shown; findings below may reference images not displayed]

FINDINGS: Brain: No evidence of acute infarction, hemorrhage, hydrocephalus,
extra-axial collection or mass lesion/mass effect.

Vascular: No hyperdense vessel or unexpected calcification.

Skull: Normal. Negative for fracture or focal lesion.

Sinuses/Orbits: Negative.

Other: None.
IMPRESSION: Normal head CT.

## 2023-01-27 ENCOUNTER — Encounter: Payer: Self-pay | Admitting: Podiatry

## 2023-01-27 ENCOUNTER — Ambulatory Visit (INDEPENDENT_AMBULATORY_CARE_PROVIDER_SITE_OTHER): Payer: 59 | Admitting: Podiatry

## 2023-01-27 DIAGNOSIS — B351 Tinea unguium: Secondary | ICD-10-CM

## 2023-01-27 DIAGNOSIS — B353 Tinea pedis: Secondary | ICD-10-CM

## 2023-01-27 MED ORDER — CLOTRIMAZOLE-BETAMETHASONE 1-0.05 % EX CREA: 1.0000 | TOPICAL_CREAM | Freq: Every day | CUTANEOUS | 0 refills | Status: AC

## 2023-01-27 NOTE — Progress Notes (Unsigned)
Subjective:  Patient ID: Melanie Jenkins, female    DOB: 1980-02-10,  MRN: [id number]  Chief Complaint  Patient presents with   Nail Problem    Hallux bilateral - toenails thick, discolored, cracked and lifted off nail bed x several months, tried OTC/homeopathic antifungals   Skin Problem    Plantar feet bilateral (R>L) - cracked, itchy, burning x years, feet sweat a lot, uses triple antibiotic ointment and homeopathic creams   New Patient (Initial Visit)    43 y.o. female presents with the above complaint.  Patient presents with bilateral hallux and fifth digit onychomycosis with thickened onychodystrophy mycotic toenails.  She also has second, bilateral athlete's foot has been going on for years.  She states she was she is due to use triple antibiotic cream which has not helped.  She wanted to discuss treatment options for it.  She is not able to take Lamisil due to liver of fatty liver disease.  She also has tried all the topical medications.  She would like to discuss laser treatment options if possible.  She denies any other acute issues   Review of Systems: Negative except as noted in the HPI. Denies N/V/F/Ch.  Past Medical History:  Diagnosis Date   Asthma    Hypoglycemia     Current Outpatient Medications:    albuterol (VENTOLIN HFA) 108 (90 Base) MCG/ACT inhaler, Inhale into the lungs., Disp: , Rfl:    clotrimazole-betamethasone (LOTRISONE) cream, Apply 1 Application topically daily., Disp: 30 g, Rfl: 0   Ascorbic Acid (VITAMIN C PO), Take 1 tablet by mouth daily. , Disp: , Rfl:   Social History   Tobacco Use  Smoking Status Former   Current packs/day: 0.50   Average packs/day: 0.5 packs/day for 8.0 years (4.0 ttl pk-yrs)   Types: Cigarettes  Smokeless Tobacco Never    No Known Allergies Objective:  There were no vitals filed for this visit. There is no height or weight on file to calculate BMI. Constitutional Well developed. Well nourished.  Vascular Dorsalis  pedis pulses palpable bilaterally. Posterior tibial pulses palpable bilaterally. Capillary refill normal to all digits.  No cyanosis or clubbing noted. Pedal hair growth normal.  Neurologic Normal speech. Oriented to person, place, and time. Epicritic sensation to light touch grossly present bilaterally.  Dermatologic Nails thickened onychodystrophy mycotic toenails bilateral hallux and fifth digit.  Mild pain on palpation Skin skin epidermolysis with subjective complaint of itching noted to bilateral feet  Orthopedic: Normal joint ROM without pain or crepitus bilaterally. No visible deformities. No bony tenderness.   Radiographs: None Assessment:   1. Tinea pedis of both feet   2. Onychomycosis due to dermatophyte   3. Nail fungus    Plan:  Patient was evaluated and treated and all questions answered.  Bilateral hallux and fifth digit onychomycosis -Educated the patient on the etiology of onychomycosis and various treatment options associated with improving the fungal load.  I explained to the patient that there is 3 treatment options available to treat the onychomycosis including topical, p.o., laser treatment.  Patient elected to undergo laser therapy and instructed her there will take 6-7 sessions.  She states understanding -Patient has fatty liver disease and is unable to go through Lamisil therapy  Bilateral athlete's foot -I explained patient etiology of athlete's foot emergency room and options were discussed.  Given the amount of athlete's foot that is present she will benefit from Lotrisone cream advised her to apply twice a day she states understand will do so  immediately.

## 2023-03-31 ENCOUNTER — Other Ambulatory Visit: Payer: 59
